# Patient Record
Sex: Male | Born: 1935 | Race: White | Hispanic: No | Marital: Married | Smoking: Former smoker
Health system: Southern US, Community
[De-identification: ages and names within clinical notes are randomized; demographics above are authoritative.]

## PROBLEM LIST (undated history)

## (undated) DIAGNOSIS — I499 Cardiac arrhythmia, unspecified: Secondary | ICD-10-CM

## (undated) DIAGNOSIS — I471 Supraventricular tachycardia: Secondary | ICD-10-CM

## (undated) DIAGNOSIS — M75100 Unspecified rotator cuff tear or rupture of unspecified shoulder, not specified as traumatic: Secondary | ICD-10-CM

## (undated) DIAGNOSIS — I10 Essential (primary) hypertension: Secondary | ICD-10-CM

## (undated) DIAGNOSIS — M1990 Unspecified osteoarthritis, unspecified site: Secondary | ICD-10-CM

## (undated) DIAGNOSIS — I4719 Other supraventricular tachycardia: Secondary | ICD-10-CM

## (undated) DIAGNOSIS — Z8719 Personal history of other diseases of the digestive system: Secondary | ICD-10-CM

## (undated) DIAGNOSIS — I219 Acute myocardial infarction, unspecified: Secondary | ICD-10-CM

## (undated) HISTORY — PX: CARDIAC CATHETERIZATION: SHX172

## (undated) HISTORY — PX: HERNIA REPAIR: SHX51

## (undated) HISTORY — PX: BACK SURGERY: SHX140

## (undated) HISTORY — PX: COLONOSCOPY: SHX174

## (undated) HISTORY — PX: SHOULDER ARTHROSCOPY W/ ROTATOR CUFF REPAIR: SHX2400

---

## 2013-11-14 HISTORY — PX: LOOP RECORDER IMPLANT: SHX5954

## 2015-03-24 ENCOUNTER — Other Ambulatory Visit: Payer: Self-pay | Admitting: Neurosurgery

## 2015-03-24 DIAGNOSIS — M549 Dorsalgia, unspecified: Secondary | ICD-10-CM

## 2015-04-07 ENCOUNTER — Other Ambulatory Visit: Payer: Self-pay

## 2015-04-30 ENCOUNTER — Other Ambulatory Visit: Payer: Self-pay | Admitting: Neurosurgery

## 2015-04-30 ENCOUNTER — Ambulatory Visit
Admission: RE | Admit: 2015-04-30 | Discharge: 2015-04-30 | Disposition: A | Discharge status: Home / Self Care | Payer: Medicare PPO | Source: Ambulatory Visit | Attending: Neurosurgery | Admitting: Neurosurgery

## 2015-04-30 DIAGNOSIS — M549 Dorsalgia, unspecified: Secondary | ICD-10-CM

## 2015-04-30 DIAGNOSIS — M48061 Spinal stenosis, lumbar region without neurogenic claudication: Secondary | ICD-10-CM

## 2015-04-30 MED ORDER — DIAZEPAM 5 MG PO TABS
5.0000 mg | ORAL_TABLET | Freq: Once | ORAL | Status: AC
  Administered 2015-04-30: 5 mg via ORAL

## 2015-04-30 MED ORDER — IOHEXOL 180 MG/ML  SOLN
15.0000 mL | Freq: Once | INTRAMUSCULAR | Status: DC | PRN
  Administered 2015-04-30: 15 mL via INTRATHECAL

## 2015-04-30 MED ORDER — ONDANSETRON HCL 4 MG/2ML IJ SOLN
4.0000 mg | Freq: Once | INTRAMUSCULAR | Status: AC
  Administered 2015-04-30: 4 mg via INTRAMUSCULAR

## 2015-04-30 MED ORDER — MEPERIDINE HCL 100 MG/ML IJ SOLN
50.0000 mg | Freq: Once | INTRAMUSCULAR | Status: AC
  Administered 2015-04-30: 50 mg via INTRAMUSCULAR

## 2015-04-30 NOTE — Progress Notes (Signed)
Patient states he has been off Eliquis for the past two days.

## 2015-04-30 NOTE — Discharge Instructions (Signed)

## 2015-05-06 ENCOUNTER — Other Ambulatory Visit: Payer: Medicare PPO

## 2015-05-08 ENCOUNTER — Other Ambulatory Visit: Payer: Self-pay | Admitting: Neurosurgery

## 2015-05-08 ENCOUNTER — Ambulatory Visit
Admission: RE | Admit: 2015-05-08 | Discharge: 2015-05-08 | Disposition: A | Discharge status: Home / Self Care | Payer: Medicare PPO | Source: Ambulatory Visit | Attending: Neurosurgery | Admitting: Neurosurgery

## 2015-05-08 DIAGNOSIS — M48061 Spinal stenosis, lumbar region without neurogenic claudication: Secondary | ICD-10-CM

## 2015-05-08 MED ORDER — IOHEXOL 180 MG/ML  SOLN
1.0000 mL | Freq: Once | INTRAMUSCULAR | Status: DC | PRN
  Administered 2015-05-08: 1 mL via EPIDURAL

## 2015-05-08 MED ORDER — METHYLPREDNISOLONE ACETATE 40 MG/ML INJ SUSP (RADIOLOG
120.0000 mg | Freq: Once | INTRAMUSCULAR | Status: AC
  Administered 2015-05-08: 120 mg via EPIDURAL

## 2015-05-08 NOTE — Discharge Instructions (Signed)

## 2015-05-27 ENCOUNTER — Other Ambulatory Visit: Payer: Self-pay | Admitting: Neurosurgery

## 2015-06-06 NOTE — Pre-Procedure Instructions (Signed)
    Melvin DoorJohnny Strickland  06/06/2015      [id number] [name]- [place], [place] - [address] [place] [id number] Phone: [phone] Fax: [phone]    Your procedure is scheduled on Tuesday, December 27.   [facility] Ad[facility] at 6:00 A.M.                Your surgery or procedure is scheduled for 10:00 AM.   Call this number if you have problems the morning of surgery :[phone]                    For any other questions, please call [phone], Monday - Friday 8 AM - 4 PM.   Remember:  Do not eat food or drink liquids after midnight Monday, December 26.  Take these medicines the morning of surgery with A SIP OF WATER : amiodarone (PACERONE), rosuvastatin (CRESTOR).                Stop Aspirin and Eliquis as instructed by your Dr.    Drucilla Schmidto not wear jewelry, make-up or nail polish.   Do not wear lotions, powders, or perfumes.                Men may shave face and neck.   Do not bring valuables to the hospital.   [facility] is not responsible for any belongings or valuables.  Contacts, dentures or bridgework may not be worn into surgery.  Leave your suitcase in the car.  After surgery it may be brought to your room.  For patients admitted to the hospital, discharge time will be determined by your treatment team.  Patients discharged the day of surgery will not be allowed to drive home.   Name and phone number of your driver:   -  Special instructions:  -  Please read over the following fact sheets that you were given. Pain Booklet, Coughing and Deep Breathing, MRSA Information and Surgical Site Infection Prevention

## 2015-06-09 ENCOUNTER — Encounter (HOSPITAL_COMMUNITY): Payer: Self-pay

## 2015-06-09 ENCOUNTER — Encounter (HOSPITAL_COMMUNITY)
Admission: RE | Admit: 2015-06-09 | Discharge: 2015-06-09 | Disposition: A | Discharge status: Home / Self Care | Payer: Medicare PPO | Source: Ambulatory Visit | Attending: Neurosurgery | Admitting: Neurosurgery

## 2015-06-09 DIAGNOSIS — M5136 Other intervertebral disc degeneration, lumbar region: Secondary | ICD-10-CM | POA: Insufficient documentation

## 2015-06-09 DIAGNOSIS — Z7982 Long term (current) use of aspirin: Secondary | ICD-10-CM | POA: Insufficient documentation

## 2015-06-09 DIAGNOSIS — Z01812 Encounter for preprocedural laboratory examination: Secondary | ICD-10-CM | POA: Insufficient documentation

## 2015-06-09 DIAGNOSIS — Z01818 Encounter for other preprocedural examination: Secondary | ICD-10-CM | POA: Diagnosis not present

## 2015-06-09 DIAGNOSIS — Z87891 Personal history of nicotine dependence: Secondary | ICD-10-CM | POA: Insufficient documentation

## 2015-06-09 DIAGNOSIS — I252 Old myocardial infarction: Secondary | ICD-10-CM | POA: Insufficient documentation

## 2015-06-09 DIAGNOSIS — Z79899 Other long term (current) drug therapy: Secondary | ICD-10-CM | POA: Insufficient documentation

## 2015-06-09 DIAGNOSIS — Z95818 Presence of other cardiac implants and grafts: Secondary | ICD-10-CM | POA: Insufficient documentation

## 2015-06-09 DIAGNOSIS — Z7902 Long term (current) use of antithrombotics/antiplatelets: Secondary | ICD-10-CM | POA: Diagnosis not present

## 2015-06-09 DIAGNOSIS — I2510 Atherosclerotic heart disease of native coronary artery without angina pectoris: Secondary | ICD-10-CM | POA: Diagnosis not present

## 2015-06-09 DIAGNOSIS — R001 Bradycardia, unspecified: Secondary | ICD-10-CM | POA: Insufficient documentation

## 2015-06-09 DIAGNOSIS — I10 Essential (primary) hypertension: Secondary | ICD-10-CM | POA: Diagnosis not present

## 2015-06-09 HISTORY — DX: Personal history of other diseases of the digestive system: Z87.19

## 2015-06-09 HISTORY — DX: Acute myocardial infarction, unspecified: I21.9

## 2015-06-09 HISTORY — DX: Unspecified rotator cuff tear or rupture of unspecified shoulder, not specified as traumatic: M75.100

## 2015-06-09 HISTORY — DX: Supraventricular tachycardia: I47.1

## 2015-06-09 HISTORY — DX: Cardiac arrhythmia, unspecified: I49.9

## 2015-06-09 HISTORY — DX: Other supraventricular tachycardia: I47.19

## 2015-06-09 HISTORY — DX: Essential (primary) hypertension: I10

## 2015-06-09 LAB — CBC
HEMATOCRIT: 42.8 % (ref 39.0–52.0)
HEMOGLOBIN: 14.2 g/dL (ref 13.0–17.0)
MCH: 31.1 pg (ref 26.0–34.0)
MCHC: 33.2 g/dL (ref 30.0–36.0)
MCV: 93.9 fL (ref 78.0–100.0)
Platelets: 234 10*3/uL (ref 150–400)
RBC: 4.56 MIL/uL (ref 4.22–5.81)
RDW: 12.8 % (ref 11.5–15.5)
WBC: 8.2 10*3/uL (ref 4.0–10.5)

## 2015-06-09 LAB — BASIC METABOLIC PANEL
ANION GAP: 9 (ref 5–15)
BUN: 5 mg/dL — ABNORMAL LOW (ref 6–20)
CO2: 28 mmol/L (ref 22–32)
Calcium: 9.2 mg/dL (ref 8.9–10.3)
Chloride: 103 mmol/L (ref 101–111)
Creatinine, Ser: 0.95 mg/dL (ref 0.61–1.24)
Glucose, Bld: 108 mg/dL — ABNORMAL HIGH (ref 65–99)
POTASSIUM: 4.7 mmol/L (ref 3.5–5.1)
SODIUM: 140 mmol/L (ref 135–145)

## 2015-06-09 LAB — SURGICAL PCR SCREEN
MRSA, PCR: NEGATIVE
STAPHYLOCOCCUS AUREUS: NEGATIVE

## 2015-06-09 NOTE — Progress Notes (Signed)
Multiple forms prepared for records to be faxed [facility] for [facility]. Review, from Dr. Betsy PriesG. Miller & Dr. Blair DolphinM. Valentine in [place].   Pt. Reports that he had an MI in 1980's, cath. done to [facility] at that [facility]. Then reports having another cath 2014-treated /w  2 stents at that time. Since those  stents he had episodes of passing out & he was seen by Dr. Blair DolphinM. Valentine & had a "monitor" placed under the skin & has a device that is next to his bed that monitors his heartbeat while he sleeps. Pt. Has a visit schedule /w Dr. Betsy PriesG. Miller for 06/10/2015 for surgical clearance. Pt. Knows to get instructions on Eliquis & aspirin from Dr. Hyacinth MeekerMiller at his appt. On 06/10/2015.  Call to A. Zelenak, PAC, reported pt.'s history  of "heart monitor" &  Scheduled visit for 06/10/2015.

## 2015-06-09 NOTE — Procedures (Signed)
Call to pharmacy (tech) for help with getting meds & doses recorderd properly.

## 2015-06-09 NOTE — Progress Notes (Signed)
Call to pt. To clarify his med. Dose & schedule for amiodarone. Pt. Reports its a 1/2 tab (tab is 200mg ) MWF.  Pt. States someone called him to arrive 0800 on dos due to schedule change. Note in chart on instructions sheet.

## 2015-06-09 NOTE — Pre-Procedure Instructions (Signed)
Melvin DoorJohnny Strickland  06/09/2015      [id number] Melvin Strickland- [place], [place] - [address] [place] [id number] Phone: [phone] Fax: [phone]    Your procedure is scheduled on 06/17/2015.  [facility] Admitting at 6:00 A.M.  Call this number if you have problems the morning of surgery:  [phone]   Remember:  Do not eat food or drink liquids after midnight. ON Monday NIGHT   Take these medicines the morning of surgery with A SIP OF WATER: AMIODARONE               YOU MUST GET INSTRUCTION FOR HOLDING ELIQUIS & ASPIRIN FROM DR. MILLER    Do not wear jewelry   Do not wear lotions, powders, or perfumes.  You may wear deodorant.    Men may shave face and neck.   Do not bring valuables to the hospital.   [facility] is not responsible for any belongings or valuables.  Contacts, dentures or bridgework may not be worn into surgery.  Leave your suitcase in the car.  After surgery it may be brought to your room.  For patients admitted to the hospital, discharge time will be determined by your treatment team.  Patients discharged the day of surgery will not be allowed to drive home.   Name and phone number of your driver:   With wife  Special instructions:  Special Instructions: [facility] - Preparing for Surgery  Before surgery, you can play an important role.  Because skin is not sterile, your skin needs to be as free of germs as possible.  You can reduce the number of germs on you skin by washing with CHG (chlorahexidine gluconate) soap before surgery.  CHG is an antiseptic cleaner which kills germs and bonds with the skin to continue killing germs even after washing.  Please DO NOT use if you have an allergy to CHG or antibacterial soaps.  If your skin becomes reddened/irritated stop using the CHG and inform your nurse when you arrive at Short Stay.  Do not shave (including legs and underarms) for at least 48 hours prior to the first CHG  shower.  You may shave your face.  Please follow these instructions carefully:   1.  Shower with CHG Soap the night before surgery and the  morning of Surgery.  2.  If you choose to wash your hair, wash your hair first as usual with your  normal shampoo.  3.  After you shampoo, rinse your hair and body thoroughly to remove the  Shampoo.  4.  Use CHG as you would any other liquid soap.  You can apply chg directly to the skin and wash gently with scrungie or a clean washcloth.  5.  Apply the CHG Soap to your body ONLY FROM THE NECK DOWN.    Do not use on open wounds or open sores.  Avoid contact with your eyes, ears, mouth and genitals (private parts).  Wash genitals (private parts)   with your normal soap.  6.  Wash thoroughly, paying special attention to the area where your surgery will be performed.  7.  Thoroughly rinse your body with warm water from the neck down.  8.  DO NOT shower/wash with your normal soap after using and rinsing off   the CHG Soap.  9.  Pat yourself dry with a clean towel.            10.  Wear  clean pajamas.            11.  Place clean sheets on your bed the night of your first shower and do not sleep with pets.  Day of Surgery  Do not apply any lotions/deodorants the morning of surgery.  Please wear clean clothes to the hospital/surgery center.  Please read over the following fact sheets that you were given. Pain Booklet, Coughing and Deep Breathing, MRSA Information and Surgical Site Infection Prevention

## 2015-06-10 NOTE — Progress Notes (Addendum)
Anesthesia Chart Review:  Pt is an 81 year old male scheduled for L4-5, L5-S1 foraminotomy on 06/17/2015 with Dr. Jeral FruitBotero.   PMH includes:  CAD, MI, HTN, atrial tachycardia, has loop recorder implanted ([employer], 10/2013). Former smoker. BMI 24.  Medications include: amiodarone, eliquis, ASA, lisinopril, crestor. Pt to stop eliquis 5 days prior to surgery.   Preoperative labs reviewed.    EKG 06/09/15: sinus bradycardia (54 bpm).  Awaiting records.   Rica Mastngela Kabbe, FNP-BC [facility] Short [facility] Center/Anesthesiology Phone: [phone] 06/10/2015 4:55 PM  Addendum: Cardiac clearance note received from Dr. Daryel NovemberGary Miller with [facility] [place]. He cleared patient with moderate CV risk.   10/24/13 Nuclear stress test:  1. Mild inferior hypoperfusion, resolved post stress. 2. Normal LV contraction. EF 62%. 3. No reversible ischemic perfusion defect. 4. Low risk scan.   10/01/13 Echo: 1. Mild LVH. 2. LA and RV are dilated. 3. Mild MR, TR, and PI. 4. Normal LV contraction. EF 60-65%.  07/10/14 note from EP cardiologist Dr. Collier BullockMichael Valentine says, recent "Holter monitor revealed short episodes of nonsustained ventricular tachycardia in the lower 100 120 beats per minute. There were no prolonged pauses, high-grade AV block or rapid tachyarrhythmias that were symptomatic." He had a loop recorder implanted 11/14/13 for evaluation of syncope and interrogation revealed SVT with rapid tachycardia and rates up to 160 bpm. He did not have syncope or near syncope with these. Dr. Georgena SpurlingSackett performed EP studies which revealed a variable cycle atrial tachycardia that was tiven to be very difficult to ablate and performed DCCV with initiation of sotalol therapy. Sotalol was discontinued to due prolonged QT, and he was switched to amiodarone.   Cardiac cath 08/16/11 [facility]):  - LM normal - LAD with proximal 25%, mid 25-50%, and distal 25% stenosis - CX with proximal 25%, mid 25% stenosis -  Obtuse marginal had diffuse 25% stenosis - RCA is the dominant vessel with proximal 25% and distal 75% stenosis. TL branch with 25% stenosis. PDA branch had 25% stenosis - PCI and stenting of RCA recommended.  08/16/11: PCI/DES to distal RCA. 75% stenosis reduced to mild irregularity.   If no changes, I anticipate patient can proceed with surgery as scheduled.   Velna Ochsllison Shemicka Cohrs, PA-C [facility] Short [facility]/Anesthesiology Phone [phone] 06/12/2015 3:28 PM

## 2015-06-11 ENCOUNTER — Encounter (HOSPITAL_COMMUNITY): Payer: Self-pay

## 2015-06-12 ENCOUNTER — Encounter (HOSPITAL_COMMUNITY): Payer: Self-pay

## 2015-06-13 NOTE — H&P (Signed)
Bethann BerkshireJohnny [name] is an 81 y.o. male.   Chief Complaint: bilateral leg pain HPI: patient who in the past underwent bilateral lumbar laminectomies but lately he is complaining of pain and  Burning sensation to both legs no better with conservative treatment . Myelogram is positive for recurrent stenosis  Past Medical History  Diagnosis Date  . Myocardial infarction (HCC)   . Hypertension   . Dysrhythmia   . History of hiatal hernia     told- yes- 30 yrs.ago  . Torn rotator cuff     L side   . Atrial tachycardia [facility](HCC)     Past Surgical History  Procedure Laterality Date  . Back surgery    . Hernia repair Bilateral     inguinal - repair- x2  . Shoulder arthroscopy w/ rotator cuff repair Right   . Colonoscopy    . Cardiac catheterization  1990's & [address]2014    [facility] , 2 stents- 2014  . Loop recorder implant  11/14/13    No family history on file. Social History:  reports that he quit smoking about 33 years ago. He does not have any smokeless tobacco history on file. He reports that he does not drink alcohol or use illicit drugs.  Allergies:  Allergies  Allergen Reactions  . Morphine And Related Nausea And Vomiting    "made me deathly sick"     No prescriptions prior to admission    No results found for this or any previous visit (from the past 48 hour(s)). No results found.  Review of Systems  Constitutional: Negative.   HENT: Positive for congestion. Negative for nosebleeds.   Eyes: Positive for blurred vision.  Respiratory: Negative.   Cardiovascular: Negative.   Gastrointestinal: Positive for abdominal pain.  Genitourinary: Negative.   Musculoskeletal: Positive for back pain.  Skin: Negative.   Neurological: Positive for sensory change and focal weakness.  Endo/Heme/Allergies: Negative.   Psychiatric/Behavioral: Positive for depression.    There were no vitals taken for this visit. Physical Exam hent,nl. Neck, nl. Cv, nl. Lungs, clear. Abdomen, nl.  Extremities, nl. NEURO weakness of dorsiflexion. SLR positive at 60 degrees. DTR nl. Lumbar myelogram shows stenosis at l4-5, l5s1 at the level of the foramens. Assessment/Plan Decompression from l4 to s1 . He and his wife are aware of risks and benefits  Daven Pinckney M 06/13/2015, 3:40 PM

## 2015-06-16 MED ORDER — CEFAZOLIN SODIUM-DEXTROSE 2-3 GM-% IV SOLR
2.0000 g | INTRAVENOUS | Status: AC
  Administered 2015-06-17: 2 g via INTRAVENOUS
  Filled 2015-06-16: qty 50

## 2015-06-17 ENCOUNTER — Ambulatory Visit (HOSPITAL_COMMUNITY): Payer: Medicare PPO

## 2015-06-17 ENCOUNTER — Ambulatory Visit (HOSPITAL_COMMUNITY): Payer: Medicare PPO | Admitting: Certified Registered"

## 2015-06-17 ENCOUNTER — Encounter (HOSPITAL_COMMUNITY): Payer: Self-pay | Admitting: Certified Registered"

## 2015-06-17 ENCOUNTER — Inpatient Hospital Stay (HOSPITAL_COMMUNITY)
Admission: AD | Admit: 2015-06-17 | Discharge: 2015-06-26 | DRG: 982 | Disposition: A | Discharge status: Home Health | Payer: Medicare PPO | Source: Ambulatory Visit | Attending: Neurosurgery | Admitting: Neurosurgery

## 2015-06-17 ENCOUNTER — Ambulatory Visit (HOSPITAL_COMMUNITY): Payer: Medicare PPO | Admitting: Vascular Surgery

## 2015-06-17 ENCOUNTER — Encounter (HOSPITAL_COMMUNITY)
Admission: AD | Disposition: A | Discharge status: Home Health | Payer: Self-pay | Source: Ambulatory Visit | Attending: Neurosurgery

## 2015-06-17 DIAGNOSIS — M96842 Postprocedural seroma of a musculoskeletal structure following a musculoskeletal system procedure: Secondary | ICD-10-CM | POA: Diagnosis not present

## 2015-06-17 DIAGNOSIS — M4806 Spinal stenosis, lumbar region: Secondary | ICD-10-CM | POA: Diagnosis present

## 2015-06-17 DIAGNOSIS — Y838 Other surgical procedures as the cause of abnormal reaction of the patient, or of later complication, without mention of misadventure at the time of the procedure: Secondary | ICD-10-CM | POA: Diagnosis not present

## 2015-06-17 DIAGNOSIS — I10 Essential (primary) hypertension: Secondary | ICD-10-CM | POA: Diagnosis present

## 2015-06-17 DIAGNOSIS — M79604 Pain in right leg: Secondary | ICD-10-CM | POA: Diagnosis present

## 2015-06-17 DIAGNOSIS — R51 Headache: Secondary | ICD-10-CM | POA: Diagnosis not present

## 2015-06-17 DIAGNOSIS — S065X9A Traumatic subdural hemorrhage with loss of consciousness of unspecified duration, initial encounter: Secondary | ICD-10-CM

## 2015-06-17 DIAGNOSIS — R519 Headache, unspecified: Secondary | ICD-10-CM

## 2015-06-17 DIAGNOSIS — I252 Old myocardial infarction: Secondary | ICD-10-CM | POA: Diagnosis not present

## 2015-06-17 DIAGNOSIS — S065XAA Traumatic subdural hemorrhage with loss of consciousness status unknown, initial encounter: Secondary | ICD-10-CM

## 2015-06-17 DIAGNOSIS — Y793 Surgical instruments, materials and orthopedic devices (including sutures) associated with adverse incidents: Secondary | ICD-10-CM | POA: Diagnosis not present

## 2015-06-17 DIAGNOSIS — Z87891 Personal history of nicotine dependence: Secondary | ICD-10-CM | POA: Diagnosis not present

## 2015-06-17 DIAGNOSIS — M5416 Radiculopathy, lumbar region: Secondary | ICD-10-CM | POA: Diagnosis present

## 2015-06-17 DIAGNOSIS — M48062 Spinal stenosis, lumbar region with neurogenic claudication: Secondary | ICD-10-CM | POA: Diagnosis present

## 2015-06-17 DIAGNOSIS — Z419 Encounter for procedure for purposes other than remedying health state, unspecified: Secondary | ICD-10-CM

## 2015-06-17 HISTORY — PX: LUMBAR LAMINECTOMY/DECOMPRESSION MICRODISCECTOMY: SHX5026

## 2015-06-17 SURGERY — LUMBAR LAMINECTOMY/DECOMPRESSION MICRODISCECTOMY 2 LEVELS
Anesthesia: General

## 2015-06-17 MED ORDER — HEMOSTATIC AGENTS (NO CHARGE) OPTIME
TOPICAL | Status: DC | PRN
  Administered 2015-06-17 (×2): 1 via TOPICAL

## 2015-06-17 MED ORDER — SODIUM CHLORIDE 0.9 % IJ SOLN
3.0000 mL | Freq: Two times a day (BID) | INTRAMUSCULAR | Status: DC
  Administered 2015-06-17 – 2015-06-25 (×12): 3 mL via INTRAVENOUS

## 2015-06-17 MED ORDER — MENTHOL 3 MG MT LOZG
1.0000 | LOZENGE | OROMUCOSAL | Status: DC | PRN
  Filled 2015-06-17: qty 9

## 2015-06-17 MED ORDER — FENTANYL CITRATE (PF) 100 MCG/2ML IJ SOLN
INTRAMUSCULAR | Status: AC
  Administered 2015-06-17: 50 ug via INTRAVENOUS
  Filled 2015-06-17: qty 2

## 2015-06-17 MED ORDER — FENTANYL CITRATE (PF) 250 MCG/5ML IJ SOLN
INTRAMUSCULAR | Status: DC | PRN
  Administered 2015-06-17: 150 ug via INTRAVENOUS

## 2015-06-17 MED ORDER — AMIODARONE HCL 100 MG PO TABS
100.0000 mg | ORAL_TABLET | ORAL | Status: DC
  Administered 2015-06-18 – 2015-06-25 (×4): 100 mg via ORAL
  Filled 2015-06-17 (×5): qty 1

## 2015-06-17 MED ORDER — ONDANSETRON HCL 4 MG/2ML IJ SOLN
INTRAMUSCULAR | Status: AC
  Filled 2015-06-17: qty 2

## 2015-06-17 MED ORDER — NEOSTIGMINE METHYLSULFATE 10 MG/10ML IV SOLN
INTRAVENOUS | Status: AC
  Filled 2015-06-17: qty 1

## 2015-06-17 MED ORDER — CEFAZOLIN SODIUM 1-5 GM-% IV SOLN
1.0000 g | Freq: Three times a day (TID) | INTRAVENOUS | Status: AC
  Administered 2015-06-17 – 2015-06-18 (×2): 1 g via INTRAVENOUS
  Filled 2015-06-17 (×2): qty 50

## 2015-06-17 MED ORDER — LIDOCAINE HCL (CARDIAC) 20 MG/ML IV SOLN
INTRAVENOUS | Status: AC
  Filled 2015-06-17: qty 5

## 2015-06-17 MED ORDER — PHENOL 1.4 % MT LIQD: 1.0000 | OROMUCOSAL | Status: DC | PRN

## 2015-06-17 MED ORDER — ONDANSETRON HCL 4 MG/2ML IJ SOLN
4.0000 mg | INTRAMUSCULAR | Status: DC | PRN
  Administered 2015-06-17 (×2): 4 mg via INTRAVENOUS
  Filled 2015-06-17 (×2): qty 2

## 2015-06-17 MED ORDER — PROPOFOL 10 MG/ML IV BOLUS
INTRAVENOUS | Status: DC | PRN
  Administered 2015-06-17: 100 mg via INTRAVENOUS

## 2015-06-17 MED ORDER — MAGNESIUM HYDROXIDE 400 MG/5ML PO SUSP
30.0000 mL | Freq: Every day | ORAL | Status: DC | PRN
  Filled 2015-06-17: qty 30

## 2015-06-17 MED ORDER — FENTANYL CITRATE (PF) 100 MCG/2ML IJ SOLN
INTRAMUSCULAR | Status: AC
  Administered 2015-06-17: 25 ug via INTRAVENOUS
  Filled 2015-06-17: qty 2

## 2015-06-17 MED ORDER — ROSUVASTATIN CALCIUM 10 MG PO TABS
10.0000 mg | ORAL_TABLET | Freq: Every day | ORAL | Status: DC
  Administered 2015-06-17 – 2015-06-26 (×9): 10 mg via ORAL
  Filled 2015-06-17 (×11): qty 1

## 2015-06-17 MED ORDER — HYDROMORPHONE HCL 1 MG/ML IJ SOLN
1.0000 mg | INTRAMUSCULAR | Status: DC | PRN
  Administered 2015-06-17 – 2015-06-18 (×4): 1 mg via INTRAVENOUS
  Filled 2015-06-17 (×5): qty 1

## 2015-06-17 MED ORDER — THROMBIN 5000 UNITS EX SOLR
CUTANEOUS | Status: DC | PRN
  Administered 2015-06-17 (×4): 5000 [IU] via TOPICAL

## 2015-06-17 MED ORDER — OXYCODONE-ACETAMINOPHEN 5-325 MG PO TABS
1.0000 | ORAL_TABLET | ORAL | Status: DC | PRN
  Administered 2015-06-17 (×2): 2 via ORAL
  Filled 2015-06-17 (×2): qty 2

## 2015-06-17 MED ORDER — ONDANSETRON HCL 4 MG/2ML IJ SOLN
INTRAMUSCULAR | Status: DC | PRN
  Administered 2015-06-17: 4 mg via INTRAVENOUS

## 2015-06-17 MED ORDER — SODIUM CHLORIDE 0.9 % IV SOLN: 250.0000 mL | INTRAVENOUS | Status: DC

## 2015-06-17 MED ORDER — FENTANYL CITRATE (PF) 100 MCG/2ML IJ SOLN: 25.0000 ug | INTRAMUSCULAR | Status: DC | PRN

## 2015-06-17 MED ORDER — SODIUM CHLORIDE 0.9 % IV SOLN
INTRAVENOUS | Status: DC
  Administered 2015-06-17 – 2015-06-18 (×2): via INTRAVENOUS
  Administered 2015-06-20: 75 mL/h via INTRAVENOUS

## 2015-06-17 MED ORDER — LACTATED RINGERS IV SOLN: INTRAVENOUS | Status: DC

## 2015-06-17 MED ORDER — LISINOPRIL 10 MG PO TABS
10.0000 mg | ORAL_TABLET | Freq: Every day | ORAL | Status: DC
  Administered 2015-06-18 – 2015-06-26 (×9): 10 mg via ORAL
  Filled 2015-06-17 (×9): qty 1

## 2015-06-17 MED ORDER — EPHEDRINE SULFATE 50 MG/ML IJ SOLN
INTRAMUSCULAR | Status: DC | PRN
  Administered 2015-06-17: 5 mg via INTRAVENOUS
  Administered 2015-06-17: 10 mg via INTRAVENOUS

## 2015-06-17 MED ORDER — NEOSTIGMINE METHYLSULFATE 10 MG/10ML IV SOLN
INTRAVENOUS | Status: DC | PRN
  Administered 2015-06-17: 3 mg via INTRAVENOUS

## 2015-06-17 MED ORDER — SODIUM CHLORIDE 0.9 % IJ SOLN: 3.0000 mL | INTRAMUSCULAR | Status: DC | PRN

## 2015-06-17 MED ORDER — ASPIRIN EC 81 MG PO TBEC
162.0000 mg | DELAYED_RELEASE_TABLET | Freq: Every day | ORAL | Status: DC
  Administered 2015-06-17 – 2015-06-26 (×10): 162 mg via ORAL
  Filled 2015-06-17 (×11): qty 2

## 2015-06-17 MED ORDER — ROCURONIUM BROMIDE 50 MG/5ML IV SOLN
INTRAVENOUS | Status: AC
  Filled 2015-06-17: qty 1

## 2015-06-17 MED ORDER — OXYCODONE HCL 5 MG PO TABS
10.0000 mg | ORAL_TABLET | ORAL | Status: DC | PRN
  Administered 2015-06-17 – 2015-06-20 (×6): 10 mg via ORAL
  Administered 2015-06-20: 5 mg via ORAL
  Filled 2015-06-17 (×8): qty 2

## 2015-06-17 MED ORDER — FENTANYL CITRATE (PF) 250 MCG/5ML IJ SOLN
INTRAMUSCULAR | Status: AC
  Filled 2015-06-17: qty 5

## 2015-06-17 MED ORDER — ONDANSETRON HCL 4 MG/2ML IJ SOLN: 4.0000 mg | Freq: Once | INTRAMUSCULAR | Status: DC | PRN

## 2015-06-17 MED ORDER — BUPIVACAINE LIPOSOME 1.3 % IJ SUSP
INTRAMUSCULAR | Status: DC | PRN
  Administered 2015-06-17: 20 mL

## 2015-06-17 MED ORDER — ALUM & MAG HYDROXIDE-SIMETH 200-200-20 MG/5ML PO SUSP
30.0000 mL | ORAL | Status: DC | PRN
  Administered 2015-06-18 – 2015-06-21 (×4): 30 mL via ORAL
  Filled 2015-06-17 (×3): qty 30

## 2015-06-17 MED ORDER — GLYCOPYRROLATE 0.2 MG/ML IJ SOLN
INTRAMUSCULAR | Status: AC
  Filled 2015-06-17: qty 2

## 2015-06-17 MED ORDER — LIDOCAINE HCL (CARDIAC) 20 MG/ML IV SOLN
INTRAVENOUS | Status: DC | PRN
  Administered 2015-06-17: 100 mg via INTRAVENOUS

## 2015-06-17 MED ORDER — MORPHINE SULFATE (PF) 2 MG/ML IV SOLN
1.0000 mg | INTRAVENOUS | Status: DC | PRN
  Administered 2015-06-17: 2 mg via INTRAVENOUS
  Filled 2015-06-17: qty 1

## 2015-06-17 MED ORDER — DIAZEPAM 5 MG PO TABS
5.0000 mg | ORAL_TABLET | Freq: Four times a day (QID) | ORAL | Status: DC | PRN
  Administered 2015-06-17 – 2015-06-18 (×3): 5 mg via ORAL
  Filled 2015-06-17 (×3): qty 1

## 2015-06-17 MED ORDER — FENTANYL CITRATE (PF) 100 MCG/2ML IJ SOLN
25.0000 ug | INTRAMUSCULAR | Status: DC | PRN
  Administered 2015-06-17: 25 ug via INTRAVENOUS
  Administered 2015-06-17 (×2): 50 ug via INTRAVENOUS

## 2015-06-17 MED ORDER — GLYCOPYRROLATE 0.2 MG/ML IJ SOLN
INTRAMUSCULAR | Status: DC | PRN
  Administered 2015-06-17: 0.4 mg via INTRAVENOUS

## 2015-06-17 MED ORDER — ACETAMINOPHEN 650 MG RE SUPP: 650.0000 mg | RECTAL | Status: DC | PRN

## 2015-06-17 MED ORDER — 0.9 % SODIUM CHLORIDE (POUR BTL) OPTIME
TOPICAL | Status: DC | PRN
  Administered 2015-06-17: 1000 mL

## 2015-06-17 MED ORDER — ROCURONIUM BROMIDE 100 MG/10ML IV SOLN
INTRAVENOUS | Status: DC | PRN
  Administered 2015-06-17: 50 mg via INTRAVENOUS

## 2015-06-17 MED ORDER — THROMBIN 5000 UNITS EX SOLR
OROMUCOSAL | Status: DC | PRN
  Administered 2015-06-17: 11:00:00 via TOPICAL

## 2015-06-17 MED ORDER — ACETAMINOPHEN 325 MG PO TABS
650.0000 mg | ORAL_TABLET | ORAL | Status: DC | PRN
  Administered 2015-06-18 – 2015-06-24 (×12): 650 mg via ORAL
  Filled 2015-06-17 (×13): qty 2

## 2015-06-17 MED ORDER — LACTATED RINGERS IV SOLN
INTRAVENOUS | Status: DC
  Administered 2015-06-17: 50 mL/h via INTRAVENOUS
  Administered 2015-06-17: 10:00:00 via INTRAVENOUS

## 2015-06-17 MED ORDER — BUPIVACAINE LIPOSOME 1.3 % IJ SUSP
20.0000 mL | INTRAMUSCULAR | Status: AC
  Filled 2015-06-17: qty 20

## 2015-06-17 MED ORDER — PROPOFOL 10 MG/ML IV BOLUS
INTRAVENOUS | Status: AC
  Filled 2015-06-17: qty 20

## 2015-06-17 SURGICAL SUPPLY — 57 items
BENZOIN TINCTURE PRP APPL 2/3 (GAUZE/BANDAGES/DRESSINGS) ×3 IMPLANT
BLADE CLIPPER SURG (BLADE) IMPLANT
BUR ACORN 6.0 (BURR) IMPLANT
BUR ACORN 6.0MM (BURR)
BUR MATCHSTICK NEURO 3.0 LAGG (BURR) ×3 IMPLANT
CANISTER SUCT 3000ML PPV (MISCELLANEOUS) ×3 IMPLANT
CLOSURE WOUND 1/2 X4 (GAUZE/BANDAGES/DRESSINGS) ×1
DRAPE LAPAROTOMY 100X72X124 (DRAPES) ×3 IMPLANT
DRAPE MICROSCOPE LEICA (MISCELLANEOUS) ×3 IMPLANT
DRAPE POUCH INSTRU U-SHP 10X18 (DRAPES) ×3 IMPLANT
DRSG OPSITE POSTOP 4X8 (GAUZE/BANDAGES/DRESSINGS) ×3 IMPLANT
DRSG PAD ABDOMINAL 8X10 ST (GAUZE/BANDAGES/DRESSINGS) IMPLANT
DURAPREP 26ML APPLICATOR (WOUND CARE) ×3 IMPLANT
DURASEAL APPLICATOR TIP (TIP) ×3 IMPLANT
DURASEAL SPINE SEALANT 3ML (MISCELLANEOUS) ×3 IMPLANT
ELECT REM PT RETURN 9FT ADLT (ELECTROSURGICAL) ×3
ELECTRODE REM PT RTRN 9FT ADLT (ELECTROSURGICAL) ×1 IMPLANT
GAUZE SPONGE 4X4 12PLY STRL (GAUZE/BANDAGES/DRESSINGS) ×3 IMPLANT
GAUZE SPONGE 4X4 16PLY XRAY LF (GAUZE/BANDAGES/DRESSINGS) IMPLANT
GLOVE BIO SURGEON STRL SZ8 (GLOVE) ×3 IMPLANT
GLOVE BIO SURGEON STRL SZ8.5 (GLOVE) ×3 IMPLANT
GLOVE BIOGEL M 8.0 STRL (GLOVE) ×3 IMPLANT
GLOVE ECLIPSE 7.5 STRL STRAW (GLOVE) ×3 IMPLANT
GLOVE EXAM NITRILE LRG STRL (GLOVE) IMPLANT
GLOVE EXAM NITRILE MD LF STRL (GLOVE) IMPLANT
GLOVE EXAM NITRILE XL STR (GLOVE) IMPLANT
GLOVE EXAM NITRILE XS STR PU (GLOVE) IMPLANT
GLOVE INDICATOR 7.5 STRL GRN (GLOVE) ×3 IMPLANT
GOWN STRL REUS W/ TWL LRG LVL3 (GOWN DISPOSABLE) ×1 IMPLANT
GOWN STRL REUS W/ TWL XL LVL3 (GOWN DISPOSABLE) ×1 IMPLANT
GOWN STRL REUS W/TWL 2XL LVL3 (GOWN DISPOSABLE) ×3 IMPLANT
GOWN STRL REUS W/TWL LRG LVL3 (GOWN DISPOSABLE) ×2
GOWN STRL REUS W/TWL XL LVL3 (GOWN DISPOSABLE) ×2
HEMOSTAT POWDER SURGIFOAM 1G (HEMOSTASIS) ×3 IMPLANT
KIT BASIN OR (CUSTOM PROCEDURE TRAY) ×3 IMPLANT
KIT ROOM TURNOVER OR (KITS) ×3 IMPLANT
NEEDLE HYPO 18GX1.5 BLUNT FILL (NEEDLE) IMPLANT
NEEDLE HYPO 21X1.5 SAFETY (NEEDLE) ×3 IMPLANT
NEEDLE HYPO 25X1 1.5 SAFETY (NEEDLE) IMPLANT
NEEDLE SPNL 20GX3.5 QUINCKE YW (NEEDLE) IMPLANT
NS IRRIG 1000ML POUR BTL (IV SOLUTION) ×3 IMPLANT
PACK LAMINECTOMY NEURO (CUSTOM PROCEDURE TRAY) ×3 IMPLANT
PAD ARMBOARD 7.5X6 YLW CONV (MISCELLANEOUS) ×9 IMPLANT
PATTIES SURGICAL .5 X1 (DISPOSABLE) ×3 IMPLANT
RUBBERBAND STERILE (MISCELLANEOUS) ×6 IMPLANT
SPONGE LAP 4X18 X RAY DECT (DISPOSABLE) IMPLANT
SPONGE SURGIFOAM ABS GEL SZ50 (HEMOSTASIS) ×6 IMPLANT
STRIP CLOSURE SKIN 1/2X4 (GAUZE/BANDAGES/DRESSINGS) ×2 IMPLANT
SUT VIC AB 0 CT1 18XCR BRD8 (SUTURE) ×2 IMPLANT
SUT VIC AB 0 CT1 8-18 (SUTURE) ×4
SUT VIC AB 2-0 CP2 18 (SUTURE) ×3 IMPLANT
SUT VIC AB 3-0 SH 8-18 (SUTURE) ×3 IMPLANT
SYR 20CC LL (SYRINGE) ×3 IMPLANT
SYR 5ML LL (SYRINGE) IMPLANT
TOWEL OR 17X24 6PK STRL BLUE (TOWEL DISPOSABLE) ×3 IMPLANT
TOWEL OR 17X26 10 PK STRL BLUE (TOWEL DISPOSABLE) ×3 IMPLANT
WATER STERILE IRR 1000ML POUR (IV SOLUTION) ×3 IMPLANT

## 2015-06-17 NOTE — Progress Notes (Signed)
Pt admitted to 5C17 from neuro PACU.  Pt is to be on flat bedrest x 24 hours and patient and family understand.  Pt complaining of pain.  Medicated in PACU just before transfer over.  Explained to patient and family, asking for meds, I will medicate as soon as possible.  Bed alarm set;  Call bell within reach.  Pt verbalizes understanding of calling for assistance.  Will continue to monitor and assess.

## 2015-06-17 NOTE — Progress Notes (Signed)
Patient ID: Melvin DoorJohnny Strickland, male   DOB: 1931-02-08, 81 y.o.   MRN: [id number] C/o incisional pain plus pain in both knees.  Switch to pca moephine

## 2015-06-17 NOTE — Anesthesia Preprocedure Evaluation (Addendum)
Anesthesia Evaluation  Patient identified by MRN, date of birth, ID band Patient awake    Reviewed: Allergy & Precautions, NPO status , Patient's Chart, lab work & pertinent test results  History of Anesthesia Complications Negative for: history of anesthetic complications  Airway Mallampati: II  TM Distance: >3 FB Neck ROM: Full    Dental  (+) Dental Advisory Given, Edentulous Upper, Edentulous Lower, Lower Dentures, Upper Dentures   Pulmonary former smoker,    Pulmonary exam normal breath sounds clear to auscultation       Cardiovascular Exercise Tolerance: Good hypertension, Pt. on medications + CAD, + Past MI and + Cardiac Stents  + dysrhythmias (atrial tachycardia)  Rhythm:Regular Rate:Bradycardia  10/24/13 Nuclear stress test:  1. Mild inferior hypoperfusion, resolved post stress. 2. Normal LV contraction. EF 62%. 3. No reversible ischemic perfusion defect. 4. Low risk scan.   10/01/13 Echo: 1. Mild LVH. 2. LA and RV are dilated. 3. Mild MR, TR, and PI. 4. Normal LV contraction. EF 60-65%.  07/10/14 note from EP cardiologist Dr. Collier Bullock says, recent "Holter monitor revealed short episodes of nonsustained ventricular tachycardia in the lower 100 120 beats per minute. There were no prolonged pauses, high-grade AV block or rapid tachyarrhythmias that were symptomatic." He had a loop recorder implanted 11/14/13 for evaluation of syncope and interrogation revealed SVT with rapid tachycardia and rates up to 160 bpm. He did not have syncope or near syncope with these. Dr. Georgena Spurling performed EP studies which revealed a variable cycle atrial tachycardia that was tiven to be very difficult to ablate and performed DCCV with initiation of sotalol therapy. Sotalol was discontinued to due prolonged QT, and he was switched to amiodarone.   Cardiac cath 08/16/11 [facility]):  - LM normal - LAD with proximal 25%, mid 25-50%, and distal  25% stenosis - CX with proximal 25%, mid 25% stenosis - Obtuse marginal had diffuse 25% stenosis - RCA is the dominant vessel with proximal 25% and distal 75% stenosis. TL branch with 25% stenosis. PDA branch had 25% stenosis - PCI and stenting of RCA recommended.   08/16/11: PCI/DES to distal RCA. 75% stenosis reduced to mild irregularity.   Neuro/Psych negative neurological ROS  negative psych ROS   GI/Hepatic negative GI ROS, Neg liver ROS,   Endo/Other  negative endocrine ROS  Renal/GU negative Renal ROS     Musculoskeletal negative musculoskeletal ROS (+)   Abdominal   Peds  Hematology  (+) Blood dyscrasia (on Apixaban- stopped 12/21), ,   Anesthesia Other Findings Day of surgery medications reviewed with the patient.  Reproductive/Obstetrics                            Anesthesia Physical Anesthesia Plan  ASA: III  Anesthesia Plan: General   Post-op Pain Management:    Induction: Intravenous  Airway Management Planned: Oral ETT  Additional Equipment:   Intra-op Plan:   Post-operative Plan: Extubation in OR  Informed Consent: I have reviewed the patients History and Physical, chart, labs and discussed the procedure including the risks, benefits and alternatives for the proposed anesthesia with the patient or authorized representative who has indicated his/her understanding and acceptance.   Dental advisory given  Plan Discussed with: CRNA  Anesthesia Plan Comments: (Risks/benefits of general anesthesia discussed with patient including risk of damage to teeth, lips, gum, and tongue, nausea/vomiting, allergic reactions to medications, and the possibility of heart attack, stroke and death.  All patient questions  answered.  Patient wishes to proceed.)        Anesthesia Quick Evaluation

## 2015-06-17 NOTE — Anesthesia Procedure Notes (Signed)
Procedure Name: Intubation Date/Time: 06/17/2015 9:29 AM Performed by: Jerilee HohMUMM, Hayden Mabin N Pre-anesthesia Checklist: Patient identified, Emergency Drugs available, Suction available and Patient being monitored Patient Re-evaluated:Patient Re-evaluated prior to inductionOxygen Delivery Method: Circle system utilized Preoxygenation: Pre-oxygenation with 100% oxygen Intubation Type: IV induction Ventilation: Mask ventilation without difficulty Laryngoscope Size: Mac and 4 Grade View: Grade I Tube type: Oral Tube size: 7.5 mm Number of attempts: 1 Airway Equipment and Method: Stylet Placement Confirmation: ETT inserted through vocal cords under direct vision,  positive ETCO2 and breath sounds checked- equal and bilateral Secured at: 22 cm Tube secured with: Tape Dental Injury: Teeth and Oropharynx as per pre-operative assessment

## 2015-06-17 NOTE — Transfer of Care (Signed)
Immediate Anesthesia Transfer of Care Note  Patient: Melvin Strickland  Procedure(s) Performed: Procedure(s): lumbar four-five,lumbar five sacral-one Foraminotomy (N/A)  Patient Location: PACU  Anesthesia Type:General  Level of Consciousness: awake, alert , oriented and patient cooperative  Airway & Oxygen Therapy: Patient Spontanous Breathing and Patient connected to nasal cannula oxygen  Post-op Assessment: Report given to RN, Post -op Vital signs reviewed and stable and Patient moving all extremities  Post vital signs: Reviewed and stable  Last Vitals:  Filed Vitals:   06/17/15 0707  BP: 165/83  Pulse: 56  Temp: 36.6 C  Resp: 20    Complications: No apparent anesthesia complications

## 2015-06-17 NOTE — Final Progress Note (Signed)
No chances in his contion, surheru as planned

## 2015-06-17 NOTE — Op Note (Signed)
NAMNunzio Cory:  Cohenour, Tesean               ACCOUNT NO.:  [id number]  MEDICAL RECORD NO.:  [id number]  LOCATION:  [facility]                         FACILITY:  [facility]  PHYSICIAN:  Hilda LiasErnesto Sally-Anne Wamble, M.D.   DATE OF BIRTH:  01/09/1931  DATE OF PROCEDURE:  06/17/2015 DATE OF DISCHARGE:                              OPERATIVE REPORT   PREOPERATIVE DIAGNOSIS:  Recurrent L4-5, L5-S1 lumbar stenosis. Chronic radiculopathy.  POSTOPERATIVE DIAGNOSIS:  Recurrent L4-5, L5-S1 lumbar stenosis. Chronic radiculopathy.  PROCEDURE:  Bilateral L4-5 laminectomy, foraminotomy, lysis of adhesion mostly posterolateral to the right, microscope.  SURGEON:  Hilda LiasErnesto Jaquaya Coyle, M.D.  ASSISTANT:  Dr. Lovell SheehanJenkins.  INDICATION:  The patient is an 81 year old gentleman who [place] years [place].  We have no details about what type of surgery, but nevertheless the patient did well for a while and he came back now with worsening of the pain.  We did an MRI which was useless. Lumbar myelogram showed stenosis at the L4-5 and L5-S1.  The patient has intact L5 and some of the L4 lamina.  The foramina were quite narrow and he has a degenerative disk disease.  He had no mobility with flexion and extension views.  He and his family knew the risk of the surgery including the possibility of no improvement, need for further surgery, CSF leak, and infection.  DESCRIPTION OF PROCEDURE:  The patient was taken to the OR and after intubation, he was positioned in prone manner.  The back was cleaned first with Betadine and later on with DuraPrep.  Then, drapes were applied, and a midline incision following the previous one was made through the skin and subcutaneous tissue.  We identified immediately the spinal process of L5 and then the lower part of the process of L4 which was left behind.  From then on, we started dissection from one below going to the midline.  What we found in the midline was this patient had severe  fibrosis posterolaterally to the right going to the foramen. Because of that, we had to start using the microscope and with the help of the drill and the 1 or 2 mm Kerrison punch, we were able to do lysis of adhesions to free the L3, L4, and L5 nerve roots bilaterally.  The one which was the worse was the L5 on the left side.  At the end, we had plenty of room for the nerve roots as well as the thecal sac.  The disks were flat.  Valsalva maneuver was negative, but because we did quite a bit of dissection, we left some surgical wound.  From then on, the area was irrigated.  Muscle closed with different layers of Vicryl and the skin with staples.         ______________________________ Hilda LiasErnesto Ferry Matthis, M.D.    EB/MEDQ  D:  06/17/2015  T:  06/17/2015  Job:  [id number]

## 2015-06-17 NOTE — Anesthesia Postprocedure Evaluation (Signed)
Anesthesia Post Note  Patient: Melvin Strickland  Procedure(s) Performed: Procedure(s) (LRB): lumbar four-five,lumbar five sacral-one Foraminotomy (N/A)  Patient location during evaluation: PACU Anesthesia Type: General Level of consciousness: awake and alert, oriented, patient cooperative and awake Pain management: pain level controlled Vital Signs Assessment: post-procedure vital signs reviewed and stable Respiratory status: spontaneous breathing, nonlabored ventilation, respiratory function stable and patient connected to nasal cannula oxygen Cardiovascular status: blood pressure returned to baseline and stable Postop Assessment: no signs of nausea or vomiting Anesthetic complications: no    Last Vitals:  Filed Vitals:   06/17/15 1215 06/17/15 1230  BP: 136/70 143/72  Pulse: 60 62  Temp:  36.5 C  Resp: 17 24    Last Pain:  Filed Vitals:   06/17/15 1239  PainSc: 4                  Cecile HearingStephen Edward Turk

## 2015-06-17 NOTE — Progress Notes (Signed)
Pt has orders for iv PCA Morphine, yet to be started due to pt's allergy to morphine, currently on iv dilaudid for pain control, Dr Lovell SheehanJenkins (on call) paged to clarify orders, ordered to d/c the PCA Morphine same done, pt reassured, will however continue to monitor. Obasogie-Asidi, Supriya Beaston Efe

## 2015-06-17 NOTE — Progress Notes (Signed)
Utilization review completed.  L. J. Mella Inclan RN, BSN, CM 

## 2015-06-18 ENCOUNTER — Encounter (HOSPITAL_COMMUNITY): Payer: Self-pay | Admitting: Neurosurgery

## 2015-06-18 MED ORDER — FENTANYL 40 MCG/ML IV SOLN
INTRAVENOUS | Status: DC
  Administered 2015-06-18: 22:00:00 via INTRAVENOUS
  Administered 2015-06-19: 20 ug via INTRAVENOUS
  Administered 2015-06-19: 100 ug via INTRAVENOUS
  Administered 2015-06-19: 3.5 ug via INTRAVENOUS
  Filled 2015-06-18: qty 25

## 2015-06-18 MED ORDER — SODIUM CHLORIDE 0.9 % IJ SOLN: 9.0000 mL | INTRAMUSCULAR | Status: DC | PRN

## 2015-06-18 MED ORDER — ONDANSETRON HCL 4 MG/2ML IJ SOLN: 4.0000 mg | Freq: Four times a day (QID) | INTRAMUSCULAR | Status: DC | PRN

## 2015-06-18 MED ORDER — DIAZEPAM 5 MG PO TABS
10.0000 mg | ORAL_TABLET | Freq: Four times a day (QID) | ORAL | Status: DC | PRN
  Administered 2015-06-18 – 2015-06-20 (×4): 10 mg via ORAL
  Filled 2015-06-18 (×4): qty 2

## 2015-06-18 MED ORDER — DIPHENHYDRAMINE HCL 50 MG/ML IJ SOLN: 12.5000 mg | Freq: Four times a day (QID) | INTRAMUSCULAR | Status: DC | PRN

## 2015-06-18 MED ORDER — HEPARIN SODIUM (PORCINE) 5000 UNIT/ML IJ SOLN
5000.0000 [IU] | Freq: Three times a day (TID) | INTRAMUSCULAR | Status: DC
  Administered 2015-06-18 – 2015-06-26 (×23): 5000 [IU] via SUBCUTANEOUS
  Filled 2015-06-18 (×23): qty 1

## 2015-06-18 MED ORDER — NALOXONE HCL 0.4 MG/ML IJ SOLN: 0.4000 mg | INTRAMUSCULAR | Status: DC | PRN

## 2015-06-18 MED ORDER — DIPHENHYDRAMINE HCL 12.5 MG/5ML PO ELIX: 12.5000 mg | ORAL_SOLUTION | Freq: Four times a day (QID) | ORAL | Status: DC | PRN

## 2015-06-18 NOTE — Progress Notes (Signed)
Occupational Therapy Evaluation Patient Details Name: Melvin Strickland MRN: [id number] DOB: 05/22/1931 Today's Date: 06/18/2015    History of Present Illness Pt is an 81 y/o male who presents s/p L4-S1 laminectomy/decompression on 06/17/15.   Clinical Impression   PTA, pt was independent with all ADLs and mobility. Pt currently presents with acute back pain and limited pt's participation today. Pt required min-mod assist for functional mobility and ADLs. Pt reported a headache (7/10) throughout session. Pt will benefit from acute skilled OT to increase independence and safety with ADLs and mobility to allow for safe discharge home with assistance from family. Recommending HHOT for ADL retraining and DME recommendations listed below.    Follow Up Recommendations  Home health OT;Supervision/Assistance - 24 hour    Equipment Recommendations  3 in 1 bedside comode;Other (comment) (RW-2 wheeled)    Recommendations for Other Services       Precautions / Restrictions Precautions Precautions: Fall;Back Precaution Booklet Issued: Yes (comment) Precaution Comments: Handout was reviewed and pt was educated throughout functional mobility.  Required Braces or Orthoses: Spinal Brace Spinal Brace: Lumbar corset;Applied in sitting position Restrictions Weight Bearing Restrictions: No      Mobility Bed Mobility Overal bed mobility: Needs Assistance Bed Mobility: Rolling;Sidelying to Sit Rolling: Min assist Sidelying to sit: Mod assist;+2 for safety/equipment       General bed mobility comments: Instructed pt on log roll technique. Verbal cues for hand placement on bedrails. +2 necessary to support trunk to come to sitting position as pt had been on bedrest for 24 hours  Transfers Overall transfer level: Needs assistance Equipment used: Rolling walker (2 wheeled) Transfers: Sit to/from Stand Sit to Stand: Min assist;+2 physical assistance         General transfer comment: Min  assist for boost to stand and verbal cues for complete hip extension to avoid bending and hand placement on seated surfaces.     Balance Overall balance assessment: Needs assistance Sitting-balance support: No upper extremity supported;Feet supported Sitting balance-Leahy Scale: Poor Sitting balance - Comments: Mainly due to pain Postural control: Posterior lean;Right lateral lean Standing balance support: Bilateral upper extremity supported;During functional activity Standing balance-Leahy Scale: Poor                              ADL Overall ADL's : Needs assistance/impaired Eating/Feeding: Set up;Sitting   Grooming: Wash/dry hands;Wash/dry face;Set up;Sitting Grooming Details (indicate cue type and reason): educated on 2 cup method for oral care         Upper Body Dressing : Moderate assistance;Sitting Upper Body Dressing Details (indicate cue type and reason): to don back brace and hospital gown                         Vision Vision Assessment?: No apparent visual deficits   Perception     Praxis      Pertinent Vitals/Pain Pain Assessment: 0-10 Pain Score: 7  Pain Location: HA - consisten throughout session lying flat or standing; backl Pain Descriptors / Indicators: Headache;Operative site guarding Pain Intervention(s): Limited activity within patient's tolerance;Monitored during session;Repositioned;Premedicated before session     Hand Dominance Right   Extremity/Trunk Assessment Upper Extremity Assessment Upper Extremity Assessment: Generalized weakness   Lower Extremity Assessment Lower Extremity Assessment: Defer to PT evaluation   Cervical / Trunk Assessment Cervical / Trunk Assessment: Other exceptions Cervical / Trunk Exceptions: s/p surgery.    Communication Communication Communication:  No difficulties   Cognition Arousal/Alertness: Awake/alert Behavior During Therapy: Anxious Overall Cognitive Status: Within Functional  Limits for tasks assessed       Memory: Decreased recall of precautions             General Comments       Exercises       Shoulder Instructions      Home Living Family/patient expects to be discharged to:: Private residence Living Arrangements: Spouse/significant other;Children;Other relatives Available Help at Discharge: Family;Available 24 hours/day Type of Home: House Home Access: Stairs to enter [employer] CorporationEntrance Stairs-Number of Steps: 3   Home Layout: One level     Bathroom Shower/Tub: Walk-in [employer] consultantshower;Curtain   Bathroom Toilet: Standard Bathroom Accessibility: Yes   Home Equipment: Grab bars - tub/shower          Prior Functioning/Environment Level of Independence: Independent        Comments: Drives, likes to do yard work    OT Diagnosis: Generalized weakness;Acute pain   OT Problem List: Decreased strength;Decreased range of motion;Decreased activity tolerance;Impaired balance (sitting and/or standing);Decreased coordination;Decreased safety awareness;Decreased knowledge of precautions;Decreased knowledge of use of DME or AE;Pain   OT Treatment/Interventions: Self-care/ADL training;Therapeutic exercise;Energy conservation;DME and/or AE instruction;Therapeutic activities;Patient/family education;Balance training    OT Goals(Current goals can be found in the care plan section) Acute Rehab OT Goals Patient Stated Goal: to decrease pain OT Goal Formulation: With patient Time For Goal Achievement: 07/02/15 Potential to Achieve Goals: Good ADL Goals Pt Will Perform Lower Body Bathing: with supervision;with adaptive equipment;sitting/lateral leans;sit to/from stand Pt Will Perform Lower Body Dressing: with supervision;with adaptive equipment;sitting/lateral leans;sit to/from stand Pt Will Transfer to Toilet: with supervision;ambulating;bedside commode Pt Will Perform Toileting - Clothing Manipulation and hygiene: with supervision;with adaptive  equipment;sitting/lateral leans;sit to/from stand Pt Will Perform Tub/Shower Transfer: Shower transfer;with supervision;ambulating;3 in 1;rolling walker Additional ADL Goal #1: Pt/caregiver will independently don/doff back brace to increase independence with ADLs. Additional ADL Goal #2: Pt will demonstrate adherence to 3/3 back precautions to increase safety with ADLs.  OT Frequency: Min 3X/week   Barriers to D/C:            Co-evaluation PT/OT/SLP Co-Evaluation/Treatment: Yes Reason for Co-Treatment: Complexity of the patient's impairments (multi-system involvement);For patient/therapist safety PT goals addressed during session: Mobility/safety with mobility;Balance;Proper use of DME OT goals addressed during session: ADL's and self-care;Proper use of Adaptive equipment and DME      End of Session Equipment Utilized During Treatment: Gait belt;Rolling walker;Back brace Nurse Communication: Mobility status;Precautions  Activity Tolerance: Patient limited by pain Patient left: in chair;with call bell/phone within reach;with chair alarm set;with family/visitor present   Time: 1610-96041356-1427 OT Time Calculation (min): 31 min Charges:  OT General Charges $OT Visit: 1 Procedure OT Evaluation $Initial OT Evaluation Tier I: 1 Procedure G-Codes:    Nils PyleJulia Dierks Wach, OTR/L 06/18/2015, 3:17 PM Pager: [phone]

## 2015-06-18 NOTE — Evaluation (Addendum)
Physical Therapy Evaluation Patient Details Name: Melvin Strickland MRN: [id number] DOB: Mar 10, 1931 Today's Date: 06/18/2015   History of Present Illness  Pt is an 81 y/o male who presents s/p L4-S1 laminectomy/decompression on 06/17/15.  Clinical Impression  Pt admitted with above diagnosis. Pt currently with functional limitations due to the deficits listed below (see PT Problem List). At the time of PT eval pt was able to perform transfers and ambulation with min assist for balance and support. Pain was a limiting factor, and effected pt's ability to sit EOB without UE support. Pt reports headache consistently at a 7/10 throughout session. Pt will benefit from skilled PT to increase their independence and safety with mobility to allow discharge to the venue listed below.       Follow Up Recommendations Home health PT;Supervision for mobility/OOB    Equipment Recommendations  Rolling walker with 5" wheels;3in1 (PT)    Recommendations for Other Services       Precautions / Restrictions Precautions Precautions: Fall;Back Precaution Booklet Issued: Yes (comment) Precaution Comments: Handout was reviewed and pt was educated throughout functional mobility.  Required Braces or Orthoses: Spinal Brace Spinal Brace: Lumbar corset;Applied in sitting position Restrictions Weight Bearing Restrictions: No      Mobility  Bed Mobility Overal bed mobility: Needs Assistance Bed Mobility: Rolling;Sidelying to Sit Rolling: Min assist Sidelying to sit: Mod assist;+2 for safety/equipment       General bed mobility comments: Pt was instructed in log roll technique. VC's specifically to reach for bed rails and for hand placement throughout transition to sitting. +2 was helpful in elevating trunk to full sitting.   Transfers Overall transfer level: Needs assistance Equipment used: Rolling walker (2 wheeled) Transfers: Sit to/from Stand Sit to Stand: Min assist;+2 physical assistance          General transfer comment: Assist to power-up to full standing position. Pt was cued for hand placement on seated surface for safety.   Ambulation/Gait Ambulation/Gait assistance: Min assist Ambulation Distance (Feet): 125 Feet Assistive device: Rolling walker (2 wheeled) Gait Pattern/deviations: Step-through pattern;Decreased stride length;Trunk flexed Gait velocity: Decreased Gait velocity interpretation: Below normal speed for age/gender General Gait Details: VC's for improved posture and walker position close to pt's body. Pt required occasional min assist for balance support and walker management. Chair follow utilized.   Stairs            Wheelchair Mobility    Modified Rankin (Stroke Patients Only)       Balance Overall balance assessment: Needs assistance Sitting-balance support: Feet supported;No upper extremity supported Sitting balance-Leahy Scale: Poor Sitting balance - Comments: Mainly due to pain Postural control: Posterior lean;Right lateral lean Standing balance support: Bilateral upper extremity supported;During functional activity Standing balance-Leahy Scale: Poor                               Pertinent Vitals/Pain Pain Assessment: 0-10 Pain Score: 7  Pain Location: Headache - consistent throughout session lying flat or standing.  Pain Descriptors / Indicators: Operative site guarding;Headache Pain Intervention(s): Limited activity within patient's tolerance;Monitored during session;Repositioned    Home Living Family/patient expects to be discharged to:: Private residence Living Arrangements: Spouse/significant other;Children;Other relatives Available Help at Discharge: Family;Available 24 hours/day Type of Home: House Home Access: Stairs to enter   [employer] of Steps: 3 Home Layout: One level Home Equipment: None      Prior Function Level of Independence: Independent  Hand Dominance   Dominant  Hand: Right    Extremity/Trunk Assessment   Upper Extremity Assessment: Defer to OT evaluation           Lower Extremity Assessment: Generalized weakness (Appears R leg buckling but pt does not endorse this)      Cervical / Trunk Assessment: Other exceptions  Communication   Communication: No difficulties  Cognition Arousal/Alertness: Awake/alert Behavior During Therapy: Anxious Overall Cognitive Status: Within Functional Limits for tasks assessed       Memory: Decreased recall of precautions              General Comments      Exercises        Assessment/Plan    PT Assessment Patient needs continued PT services  PT Diagnosis Difficulty walking;Acute pain   PT Problem List Decreased strength;Decreased range of motion;Decreased activity tolerance;Decreased balance;Decreased mobility;Decreased knowledge of use of DME;Decreased safety awareness;Decreased knowledge of precautions;Pain  PT Treatment Interventions DME instruction;Gait training;Stair training;Functional mobility training;Therapeutic activities;Therapeutic exercise;Neuromuscular re-education;Patient/family education   PT Goals (Current goals can be found in the Care Plan section) Acute Rehab PT Goals Patient Stated Goal: Pain control PT Goal Formulation: With patient/family Time For Goal Achievement: 07/02/15 Potential to Achieve Goals: Good    Frequency Min 5X/week   Barriers to discharge        Co-evaluation               End of Session Equipment Utilized During Treatment: Gait belt;Back brace Activity Tolerance: Patient limited by pain Patient left: in chair;with call bell/phone within reach;with family/visitor present Nurse Communication: Mobility status      PT/OT Co-eval for pt/therapist safety and complexity of pt impairments.    Time: 1610-96041356-1429 PT Time Calculation (min) (ACUTE ONLY): 33 min   Charges:   PT Evaluation $Initial PT Evaluation Tier I: 1 Procedure    PT G  Codes:        Conni SlipperKirkman, Borden Thune 06/18/2015, 2:43 PM  Conni SlipperLaura Naydene Kamrowski, PT, DPT Acute Rehabilitation Services Pager: [phone]

## 2015-06-18 NOTE — Progress Notes (Signed)
Patient ID: Melvin DoorJohnny Limon, male   DOB: [birth year], 81 y.o.   MRN: [id number] Still complaining of incisional pain, no weakness. Unable to sleep wound dry, no sq fluid collection. See change in orders

## 2015-06-18 NOTE — Care Management Note (Signed)
Case Management Note  Patient Details  Name: Melvin DoorJohnny Strickland MRN: [id number] Date of Birth: August 02, 1930  Subjective/Objective:   Patient underwent a L4-S1 Foraminotomy. Patient is from home with his wife.                  Action/Plan: Currently patient is on bedrest. Awaiting PT/OT recommendations when able to see patient. CM will continue to follow for discharge needs.   Expected Discharge Date:                  Expected Discharge Plan:     In-House Referral:     Discharge planning Services     Post Acute Care Choice:    Choice offered to:     DME Arranged:    DME Agency:     HH Arranged:    HH Agency:     Status of Service:  In process, will continue to follow  Medicare Important Message Given:    Date Medicare IM Given:    Medicare IM give by:    Date Additional Medicare IM Given:    Additional Medicare Important Message give by:     If discussed at Long Length of Stay Meetings, dates discussed:    Additional Comments:  Kermit BaloKelli F Laritza Vokes, RN 06/18/2015, 11:05 AM

## 2015-06-18 NOTE — Progress Notes (Signed)
PT Cancellation Note  Patient Details Name: Melvin Strickland MRN: [id number] DOB: 1931-01-10   Cancelled Treatment:    Reason Eval/Treat Not Completed: Patient not medically ready. Pt with bedrest orders active until 1311. Will check back as schedule allows after this time to complete PT eval.    Conni SlipperKirkman, Harneet Noblett 06/18/2015, 9:54 AM

## 2015-06-18 NOTE — Progress Notes (Signed)
Patient ID: Melvin DoorJohnny Strickland, male   DOB: [birth year], 81 y.o.   MRN: [id number] Less pain than yesterday,no weakness. Headache when he moves his head but not with coughing   dry

## 2015-06-18 NOTE — Progress Notes (Signed)
Patient ID: Melvin DoorJohnny Cange, male   DOB: 04-09-1931, 81 y.o.   MRN: [id number] Resting well

## 2015-06-18 NOTE — Progress Notes (Signed)
Pt was unable to pass urine after several attempt, bladder scanned to the volume of greater than , pt 's bladder was drained, of clear urine drained out, pt made comfortable in bed with call light within reach, will however continue to monitor. Obasogie-Asidi, Bradd Merlos Efe

## 2015-06-19 NOTE — Clinical Social Work Note (Signed)
CSW received referral for SNF.  Case discussed with case manager, and plan is to discharge home with home health.  CSW to sign off please re-consult if social work needs arise.  Belford Pascucci R. Baylyn Sickles, MSW, LCSWA [phone]  

## 2015-06-19 NOTE — Progress Notes (Signed)
Patient ID: Melvin Strickland, male   DOB: Jul 20, 1930, 81 y.o.   MRN: [id number] Pain under control. No weakness. Less headache. No weakness. Wound dry

## 2015-06-19 NOTE — Progress Notes (Signed)
PT Cancellation Note  Patient Details Name: Melvin Strickland MRN: [id number] DOB: [birth year]   Cancelled Treatment:    Reason Eval/Treat Not Completed: Patient not medically ready. Discussed pt case with OT who states that pt had sudden onset of severe headache when transitioned to EOB. Will hold PT treatment at this time and wait until rounding physician has seen and cleared pt for further activity.    Conni SlipperKirkman, Ilina Xu 06/19/2015, 10:46 AM   Conni SlipperLaura Raelene Trew, PT, DPT Acute Rehabilitation Services Pager: [phone]

## 2015-06-19 NOTE — Progress Notes (Signed)
Bladder scan clear yellow urine. 3 previous in and out cath. Inserted foley catheter per MD order.

## 2015-06-19 NOTE — Progress Notes (Signed)
Occupational Therapy Treatment Patient Details Name: Melvin Strickland MRN: [id number] DOB: 1930-09-22 Today's Date: 06/19/2015    History of present illness Pt is an 81 y/o male who presents s/p L4-S1 laminectomy/decompression on 06/17/15.   OT comments  Session limited by onset of sudden and severe headache when sitting EOB and severe back pain. Attempted sit-stand transfers x2 with limited success. Laid pt back down to alleviate headache pain - pt stated his headache felt better after laying down ~5 minutes. Change follow up recommendations to SNF for post-acute rehab as pt's severe headache and low back pain are limiting his current ability to participate in therapy.     Follow Up Recommendations  SNF;Supervision/Assistance - 24 hour    Equipment Recommendations  3 in 1 bedside comode;Other (comment)    Recommendations for Other Services      Precautions / Restrictions Precautions Precautions: Fall;Back Precaution Booklet Issued: No Precaution Comments: Pt able to recall 1/3 back precautions at beginning of session. Reviewed precautions. Pt in too much pain to demonstrate good adherence to precautions Required Braces or Orthoses: Spinal Brace Spinal Brace: Lumbar corset;Applied in sitting position Restrictions Weight Bearing Restrictions: No       Mobility Bed Mobility Overal bed mobility: Needs Assistance Bed Mobility: Rolling;Sidelying to Sit;Sit to Sidelying Rolling: Mod assist Sidelying to sit: Max assist     Sit to sidelying: Max assist General bed mobility comments: Increased assistance needed today. Mod-max assist for trunk support and to progress LE on and off bed. Verbal cues for log roll technique and hand placement. Heavy use of bedrails, HOB flat.  Transfers Overall transfer level: Needs assistance Equipment used: Rolling walker (2 wheeled) Transfers: Sit to/from Stand Sit to Stand: Max assist         General transfer comment: x2 attempts with success  on 2nd attempt. Max assist for boost to stand and to maintain balance due to severe R lateral lean. Verbal cues to shift weight to midline but pt unable to achieve without physical assist. Verbal cues for safe hand placement on seated surfaces - pt dropped down to bed quickly without putting hand out due to pain    Balance Overall balance assessment: Needs assistance Sitting-balance support: Bilateral upper extremity supported;Feet supported Sitting balance-Leahy Scale: Poor Sitting balance - Comments: Heavy reliance on bilateral UE support to sit upright Postural control: Right lateral lean;Posterior lean Standing balance support: Bilateral upper extremity supported;During functional activity Standing balance-Leahy Scale: Poor Standing balance comment: severe R lateral lean                   ADL Overall ADL's : Needs assistance/impaired                 Upper Body Dressing : Total assistance;Sitting Upper Body Dressing Details (indicate cue type and reason): Unable to sit without bilateral UE support to don brace                   General ADL Comments: Attempted sit-stand to complete ADLs but pt had sudden severe headache and laid pt back down immediately.      Vision                     Perception     Praxis      Cognition   Behavior During Therapy: Anxious Overall Cognitive Status: Within Functional Limits for tasks assessed       Memory: Decreased recall of precautions  Extremity/Trunk Assessment               Exercises     Shoulder Instructions       General Comments      Pertinent Vitals/ Pain       Pain Assessment: 0-10 Pain Score: 8  Pain Location: back and HA upon sitting EOB Pain Descriptors / Indicators: Aching;Shooting;Operative site guarding;Headache Pain Intervention(s): Limited activity within patient's tolerance;Monitored during session;Repositioned;Premedicated before session  Home Living                                           Prior Functioning/Environment              Frequency Min 3X/week     Progress Toward Goals  OT Goals(current goals can now be found in the care plan section)  Progress towards OT goals: Not progressing toward goals - comment (Severe HA and low back pain preventing progress)  Acute Rehab OT Goals Patient Stated Goal: to decrease pain OT Goal Formulation: With patient Time For Goal Achievement: 07/02/15 Potential to Achieve Goals: Good ADL Goals Pt Will Perform Lower Body Bathing: with supervision;with adaptive equipment;sitting/lateral leans;sit to/from stand Pt Will Perform Lower Body Dressing: with supervision;with adaptive equipment;sitting/lateral leans;sit to/from stand Pt Will Transfer to Toilet: with supervision;ambulating;bedside commode Pt Will Perform Toileting - Clothing Manipulation and hygiene: with supervision;with adaptive equipment;sitting/lateral leans;sit to/from stand Pt Will Perform Tub/Shower Transfer: Shower transfer;with supervision;ambulating;3 in 1;rolling walker Additional ADL Goal #1: Pt/caregiver will independently don/doff back brace to increase independence with ADLs. Additional ADL Goal #2: Pt will demonstrate adherence to 3/3 back precautions to increase safety with ADLs.  Plan Discharge plan needs to be updated    Co-evaluation                 End of Session Equipment Utilized During Treatment: Gait belt;Rolling walker;Back brace   Activity Tolerance Patient limited by pain   Patient Left in bed;with call bell/phone within reach;with bed alarm set;with nursing/sitter in room   Nurse Communication Mobility status;Precautions;Other (comment) (Severe HA upon sitting up)        Time: 1610-96040857-0925 OT Time Calculation (min): 28 min  Charges: OT General Charges $OT Visit: 1 Procedure OT Treatments $Self Care/Home Management : 23-37 mins  Nils PyleJulia Theodor Mustin, OTR/L 06/19/2015, 9:48  AM Pager: [phone]

## 2015-06-19 NOTE — Progress Notes (Signed)
Educated pt on using PCA pump with verbalized understanding. With PCA pump infusing, pt states that PCA pump does not help with pain relief. Pt states valium works so had administered med with effective result.

## 2015-06-20 MED ORDER — DIAZEPAM 2 MG PO TABS
2.0000 mg | ORAL_TABLET | Freq: Four times a day (QID) | ORAL | Status: DC | PRN
  Administered 2015-06-22 – 2015-06-26 (×5): 2 mg via ORAL
  Filled 2015-06-20 (×5): qty 1

## 2015-06-20 MED ORDER — OXYCODONE HCL 5 MG PO TABS
5.0000 mg | ORAL_TABLET | ORAL | Status: DC | PRN
  Administered 2015-06-20 – 2015-06-26 (×15): 5 mg via ORAL
  Filled 2015-06-20 (×15): qty 1

## 2015-06-20 NOTE — Progress Notes (Signed)
Patient ID: Rosine DoorJohnny Balbach, male   DOB: [birth year], 81 y.o.   MRN: [id number] Sleepy,no headache. Dressing dry. See orders

## 2015-06-20 NOTE — Progress Notes (Signed)
Patient ID: Melvin DoorJohnny Strickland, male   DOB: 12/12/1930, 81 y.o.   MRN: [id number] More awake, less pain. Spoke with wife . Possible going home tomorrow pm or sunday

## 2015-06-20 NOTE — Care Management Important Message (Signed)
Important Message  Patient Details  Name: Rosine DoorJohnny Miggins MRN: [id number] Date of Birth: 03/29/1931   Medicare Important Message Given:  Yes    Naketa Daddario P Aaniya Sterba 06/20/2015, 2:21 PM

## 2015-06-20 NOTE — Progress Notes (Signed)
Physical Therapy Treatment Patient Details Name: Melvin DoorJohnny Strickland MRN: [id number] DOB: 04-03-1931 Today's Date: 06/20/2015    History of Present Illness Pt is an 81 y/o male who presents s/p L4-S1 laminectomy/decompression on 06/17/15.    PT Comments    Pt groggy and with poor mobility. Leaning left in both sitting and standing. Strength equal bilaterally to testing. Requiring 2 person assist for mobility and unsure if family is able to provide this. Therefore recommend ST-SNF. Nursing aware of above.  Follow Up Recommendations  SNF     Equipment Recommendations  Rolling walker with 5" wheels;3in1 (PT)    Recommendations for Other Services       Precautions / Restrictions Precautions Precautions: Fall;Back Required Braces or Orthoses: Spinal Brace Spinal Brace: Lumbar corset;Applied in sitting position Restrictions Weight Bearing Restrictions: No    Mobility  Bed Mobility Overal bed mobility: Needs Assistance Bed Mobility: Rolling;Sidelying to Sit Rolling: Min assist Sidelying to sit: Mod assist;+2 for safety/equipment       General bed mobility comments: Verbal cues. Assist to elevate trunk.  Transfers Overall transfer level: Needs assistance Equipment used: Rolling walker (2 wheeled) Transfers: Sit to/from Stand Sit to Stand: +2 physical assistance;Mod assist         General transfer comment: Verbal cues for hand placement and assist to bring hips up and correct left lateral lean  Ambulation/Gait Ambulation/Gait assistance: +2 safety/equipment;Mod assist Ambulation Distance (Feet): 30 Feet Assistive device: Rolling walker (2 wheeled) Gait Pattern/deviations: Step-through pattern;Decreased step length - right;Decreased step length - left;Shuffle Gait velocity: Decreased Gait velocity interpretation: Below normal speed for age/gender General Gait Details: Pt initially with left lateral lean that improved slightly toward end of amb. Pt also with lt knee flexed  and shortened step length left.   Stairs            Wheelchair Mobility    Modified Rankin (Stroke Patients Only)       Balance Overall balance assessment: Needs assistance Sitting-balance support: Bilateral upper extremity supported;Feet supported Sitting balance-Leahy Scale: Poor Sitting balance - Comments: Pt leaning left and required min to mod A to maintain midline Postural control: Left lateral lean Standing balance support: Bilateral upper extremity supported Standing balance-Leahy Scale: Poor Standing balance comment: Significant lt lateral lean                    Cognition Arousal/Alertness: Lethargic Behavior During Therapy: Flat affect Overall Cognitive Status: Impaired/Different from baseline Area of Impairment: Problem solving;Following commands     Memory: Decreased recall of precautions (recalled 2 of 3 precautions) Following Commands: Follows one step commands with increased time     Problem Solving: Slow processing;Decreased initiation General Comments: Pt groggy and per nurse received Valium at 3:00 AM    Exercises      General Comments        Pertinent Vitals/Pain      Home Living                      Prior Function            PT Goals (current goals can now be found in the care plan section) Progress towards PT goals: Not progressing toward goals - comment    Frequency  Min 5X/week    PT Plan Discharge plan needs to be updated    Co-evaluation             End of Session Equipment Utilized During Treatment: Gait belt;Back brace Activity  Tolerance: Patient limited by lethargy Patient left: in chair;with call bell/phone within reach;with chair alarm set     Time: 0902-0922 PT Time Calculation (min) (ACUTE ONLY): 20 min  Charges:  $Gait Training: 8-22 mins                    G Codes:      Melvin Strickland 01-Jul-2015, 9:30 AM [facility] PT [phone]

## 2015-06-21 MED ORDER — KETOROLAC TROMETHAMINE 15 MG/ML IJ SOLN
15.0000 mg | Freq: Four times a day (QID) | INTRAMUSCULAR | Status: AC
  Administered 2015-06-21 – 2015-06-26 (×20): 15 mg via INTRAVENOUS
  Filled 2015-06-21 (×20): qty 1

## 2015-06-21 NOTE — Progress Notes (Signed)
Pt offered several times throughout shift to get OOB, pt refused each time, also refused medications for constipation, stated he wanted to wait until later in the day, foley care was completed. A.M. Medication for c/o headache.

## 2015-06-21 NOTE — Progress Notes (Signed)
Dr.Pool returned page from patient's c/o of headache to the back of his head and neck; family at bedside; orders received to start Toradol; see orders.  Will continue to monitor.

## 2015-06-21 NOTE — Progress Notes (Signed)
Patient more alert today;participating in therapy out of bed; appetite somewhat improved today; continues to c/o headache on a scale of 1-10 with a score of 8 and severe to the back of his head; has been persistant post op.

## 2015-06-21 NOTE — Progress Notes (Signed)
Patient ID: Melvin Strickland, male   DOB: 07/13/1930, 81 y.o.   MRN: [id number] More awake, less lumbar pain. Minimal headache. Pt to help. Discharge tomorrow?

## 2015-06-21 NOTE — Progress Notes (Signed)
Physical Therapy Treatment Patient Details Name: Melvin Strickland MRN: [id number] DOB: 02-Sep-1930 Today's Date: 06/21/2015    History of Present Illness Pt is an 81 y/o male who presents s/p L4-S1 laminectomy/decompression on 06/17/15.    PT Comments    Patient reports severe headache for 2 days, back pain is manageable, knows he needs to get up to get better.  Improved physical performance today, MIN/MOD assist for transfers and mobility today, unsteady on feet for balance.  Limited most by headache pain, needs cues and assist for spinal precautions and don/doff of brace.  Patient continues with skilled PT need, may benefit from rehab stay prior to return home, will continue to assess.   Follow Up Recommendations  SNF     Equipment Recommendations  Rolling walker with 5" wheels;3in1 (PT)    Recommendations for Other Services       Precautions / Restrictions Precautions Precautions: Fall;Back Precaution Comments: Recalls precautions with cues, 1/3 independently Required Braces or Orthoses: Spinal Brace Spinal Brace: Lumbar corset;Applied in sitting position Restrictions Weight Bearing Restrictions: No    Mobility  Bed Mobility Overal bed mobility: Needs Assistance Bed Mobility: Rolling;Sidelying to Sit;Sit to Sidelying Rolling: Min guard Sidelying to sit: Min assist     Sit to sidelying: Min assist;Mod assist General bed mobility comments: Verbal cues. Assist to elevate trunk, bed flat, no rail.  Transfers Overall transfer level: Needs assistance Equipment used: Rolling walker (2 wheeled) Transfers: Sit to/from Stand Sit to Stand: Min assist         General transfer comment: Verbal cues for hand placement.  Ambulation/Gait Ambulation/Gait assistance: Min assist Ambulation Distance (Feet): 100 Feet Assistive device: Rolling walker (2 wheeled) Gait Pattern/deviations: Drifts right/left;Decreased stride length;Step-to pattern Gait velocity: Decreased   General  Gait Details: Gait improved with practice, initially steppage gait with R lateral lean.   Stairs Stairs: Yes Stairs assistance: Min assist Stair Management: Two rails Number of Stairs: 3    Wheelchair Mobility    Modified Rankin (Stroke Patients Only)       Balance Overall balance assessment: Needs assistance Sitting-balance support: Bilateral upper extremity supported Sitting balance-Leahy Scale: Fair Sitting balance - Comments: posterior lean tendency   Standing balance support: Bilateral upper extremity supported Standing balance-Leahy Scale: Poor Standing balance comment: MIN assist stand balance with walker                    Cognition Arousal/Alertness: Awake/alert Behavior During Therapy: WFL for tasks assessed/performed Overall Cognitive Status: Within Functional Limits for tasks assessed Area of Impairment: Problem solving     Memory: Decreased recall of precautions Following Commands: Follows one step commands with increased time     Problem Solving: Requires verbal cues;Requires tactile cues General Comments: More alert today, severe headache    Exercises      General Comments        Pertinent Vitals/Pain Pain Assessment: 0-10 Pain Score: 10-Worst pain ever Pain Location: Headache, back is 'tolerable' Pain Descriptors / Indicators: Aching;Constant Pain Intervention(s): Limited activity within patient's tolerance;Monitored during session;Patient requesting pain meds-RN notified    Home Living                      Prior Function            PT Goals (current goals can now be found in the care plan section) Acute Rehab PT Goals Patient Stated Goal: To stop his headache PT Goal Formulation: With patient/family Time For Goal Achievement: 07/02/15  Potential to Achieve Goals: Good Progress towards PT goals: Progressing toward goals    Frequency  Min 5X/week    PT Plan Discharge plan needs to be updated    Co-evaluation              End of Session Equipment Utilized During Treatment: Gait belt;Back brace Activity Tolerance: Patient tolerated treatment well;Patient limited by pain Patient left: in bed;with call bell/phone within reach;with bed alarm set;with SCD's reapplied     Time: 1335-1415 PT Time Calculation (min) (ACUTE ONLY): 40 min  Charges:  $Gait Training: 8-22 mins $Therapeutic Activity: 8-22 mins                    G CodesFreida Busman:      Laylonie Marzec L 06/21/2015, 2:34 PM

## 2015-06-22 MED ORDER — HYDROMORPHONE HCL 1 MG/ML IJ SOLN
0.5000 mg | INTRAMUSCULAR | Status: DC | PRN
  Administered 2015-06-22 – 2015-06-23 (×6): 1 mg via INTRAVENOUS
  Filled 2015-06-22 (×6): qty 1

## 2015-06-22 NOTE — Progress Notes (Signed)
Pt still complaints of headache not relieved by Toradol or pain medication administered. Pt requested for this nurse to page MD to see if something else could be ordered for headache. Pt stated that after receiving pain medication, he fell asleep but woke up in more pain. Spoke with Md instructed to administer Oxy IR, lay pt flat, and let him rest until the am. No noted distress. Will continue to monitor.

## 2015-06-22 NOTE — Progress Notes (Signed)
Subjective: Patient reports bad headache today, worse when up  Objective: Vital signs in last 24 hours: Temp:  [98 F (36.7 C)-98.4 F (36.9 C)] 98.4 F (36.9 C) (01/01 0936) Pulse Rate:  [56-107] 107 (01/01 0936) Resp:  [16-20] 18 (01/01 0936) BP: (110-145)/(58-77) 110/61 mmHg (01/01 0936) SpO2:  [95 %-99 %] 97 % (01/01 0936)  Intake/Output from previous day: 12/31 0701 - 01/01 0700 In: 240 [P.O.:240] Out: 500 [Urine:500] Intake/Output this shift: Total I/O In: -  Out: 600 [Urine:600]  Physical Exam: Dressing CDI.  Dressing flat.  Positional headache.  Legs without pain.  Lab Results: No results for input(s): WBC, HGB, HCT, PLT in the last 72 hours. BMET No results for input(s): NA, K, CL, CO2, GLUCOSE, BUN, CREATININE, CALCIUM in the last 72 hours.  Studies/Results: No results found.  Assessment/Plan: Observe today, will hold on discharge until headache better.    LOS: 5 days    Dorian HeckleSTERN,Fredric Slabach D, MD 06/22/2015, 10:10 AM

## 2015-06-22 NOTE — Progress Notes (Signed)
Pt continues to complaint of headache without relief unable to sit up in a chair at this time. Pt repositioned to side in bed. Pain medication administered, cold wash cloth applied. Md aware of headache. Will continue to monitor.

## 2015-06-22 NOTE — Clinical Social Work Note (Signed)
Clinical Social Work Assessment  Patient Details  Name: Rosine DoorJohnny Hora MRN: [id number] Date of Birth: [birth year]  Date of referral:  06/22/15               Reason for consult:  Facility Placement                Permission sought to share information with:    Permission granted to share information::  Yes, Verbal Permission Granted  Name::        Agency::  SNFs  Relationship::     Contact Information:     Housing/Transportation Living arrangements for the past 2 months:  Single Family Home Source of Information:  Patient Patient Interpreter Needed:  None Criminal Activity/Legal Involvement Pertinent to Current Situation/Hospitalization:  No - Comment as needed Significant Relationships:  Spouse Lives with:  Spouse Do you feel safe going back to the place where you live?  Yes Need for family participation in patient care:  No (Coment)  Care giving concerns:  [place] in [place] and states that he desires a facility in [place], [place].    Social Worker assessment / plan: Refer [place] to facilities in [place].  Employment status:  Retired Health and safety inspectornsurance information:    PT Recommendations:  Skilled Nursing Facility Information / Referral to community resources:  Skilled Nursing Facility  Patient/Family's Response to care: Looking forward to recovery in SNF.   Patient/Family's Understanding of and Emotional Response to Diagnosis, Current Treatment, and Prognosis: Patient feeling positive about recovery and return to baseline.   Emotional Assessment Appearance:  Appears older than stated age Attitude/Demeanor/Rapport:   (appropropirate) Affect (typically observed):  Accepting, Appropriate Orientation:  Oriented to Self, Oriented to Place, Oriented to  Time, Oriented to Situation Alcohol / Substance use:  Not Applicable Psych involvement (Current and /or in the community):  No (Comment)  Discharge Needs  Concerns to be addressed:  No discharge needs  identified Readmission within the last 30 days:    Current discharge risk:  None Barriers to Discharge:  No Barriers Identified   Halana Deisher, Harrel LemonYWAN J, LCSW 06/22/2015, 5:14 PM

## 2015-06-22 NOTE — Progress Notes (Signed)
Called on call MD regarding no change in headache from earlier pain medicine administered. MD ordered dilaudid Q3 and to keep monitoring headache. Suzy Bouchardhompson, Demarkis Gheen E, [place] 06/22/2015 2047

## 2015-06-23 ENCOUNTER — Inpatient Hospital Stay (HOSPITAL_COMMUNITY): Payer: Medicare PPO

## 2015-06-23 NOTE — Progress Notes (Signed)
Patient ID: Rosine DoorJohnny Mathison, male   DOB: Mar 05, 1931, 82 y.o.   MRN: [id number] C/o headcahe non positional. Wound dry. Waiting for ct head  Lumbar. No weakness

## 2015-06-23 NOTE — Clinical Social Work Note (Signed)
CSW met with patient and family to discuss bed offers.  Patient and family would like to either take patient home with home health or have him go to [facility] in [place].  CSW contacted [facility] to inform them that patient would like to go to SNF and they will begin insurance authorization.  CSW to continue to follow patient's progress.  Jones Broom. Candor, MSW, [place] 06/23/2015 6:54 PM

## 2015-06-23 NOTE — Progress Notes (Signed)
Physical Therapy Treatment Patient Details Name: Melvin DoorJohnny Strickland MRN: [id number] DOB: [birth year] Today's Date: 06/23/2015    History of Present Illness Pt is an 82 y/o male who presents s/p L4-S1 laminectomy/decompression on 06/17/15. Pt with headache since surgery.    PT Comments    Pt with much improved mobility. Still complaining of headache especially when up.  Follow Up Recommendations  SNF     Equipment Recommendations  Rolling walker with 5" wheels;3in1 (PT)    Recommendations for Other Services       Precautions / Restrictions Precautions Precautions: Fall;Back Required Braces or Orthoses: Spinal Brace Spinal Brace: Lumbar corset;Applied in sitting position Restrictions Weight Bearing Restrictions: No    Mobility  Bed Mobility Overal bed mobility: Needs Assistance Bed Mobility: Rolling;Sidelying to Sit;Sit to Sidelying Rolling: Min guard Sidelying to sit: Min assist       General bed mobility comments: Assist to elevate trunk.  Transfers Overall transfer level: Needs assistance Equipment used: Rolling walker (2 wheeled) Transfers: Sit to/from Stand Sit to Stand: Min assist         General transfer comment: Verbal cues for hand placement.  Ambulation/Gait Ambulation/Gait assistance: Min assist Ambulation Distance (Feet): 175 Feet Assistive device: Rolling walker (2 wheeled) Gait Pattern/deviations: Step-through pattern;Decreased stride length Gait velocity: Decreased Gait velocity interpretation: <1.8 ft/sec, indicative of risk for recurrent falls General Gait Details: Verbal cues to stand more erect.   Stairs            Wheelchair Mobility    Modified Rankin (Stroke Patients Only)       Balance   Sitting-balance support: No upper extremity supported;Feet supported Sitting balance-Leahy Scale: Fair Sitting balance - Comments: Pt able to maintain midline without difficulty   Standing balance support: Bilateral upper extremity  supported Standing balance-Leahy Scale: Poor Standing balance comment: support of walker and min A for static standing.                    Cognition Arousal/Alertness: Awake/alert Behavior During Therapy: WFL for tasks assessed/performed Overall Cognitive Status: Within Functional Limits for tasks assessed                      Exercises      General Comments        Pertinent Vitals/Pain Pain Assessment: 0-10 Pain Score: 8  Pain Location: Headache worse when up  Pain Descriptors / Indicators: Aching Pain Intervention(s): Limited activity within patient's tolerance;Monitored during session    Home Living                      Prior Function            PT Goals (current goals can now be found in the care plan section) Progress towards PT goals: Progressing toward goals    Frequency  Min 3X/week    PT Plan Current plan remains appropriate;Frequency needs to be updated    Co-evaluation             End of Session Equipment Utilized During Treatment: Gait belt;Back brace Activity Tolerance: Patient tolerated treatment well Patient left: with call bell/phone within reach;in chair;with chair alarm set;with family/visitor present     Time: [id number] PT Time Calculation (min) (ACUTE ONLY): 16 min  Charges:  $Gait Training: 8-22 mins                    G Codes:      Melvin Strickland 06/23/2015, 3:09  PM [employer] PT [phone]

## 2015-06-23 NOTE — Progress Notes (Signed)
Pt's wife is requesting that the patient go to a SNF closer to where they live.  [facility] [place].  Will continue to monitor. Sondra ComeSilva, Darell Saputo M, RN

## 2015-06-23 NOTE — Progress Notes (Signed)
Patient continues to have severe headache. Plan is to CT scan his head and spine. Plan continues to be SNF at discharge. CM will continue to follow for discharge needs.

## 2015-06-23 NOTE — [place] FL2 (Signed)
[place] MEDICAID FL2 LEVEL OF CARE SCREENING TOOL     IDENTIFICATION  Patient Name: Melvin DoorJohnny Dembeck Birthdate: 1930-10-03 Sex: male Admission Date (Current Location): 06/17/2015  [facility] [place]:      Facility and Address:  [facility] [place], [address], [place], [place] [address]      Provider Number: [id number]  Attending Physician Name and Address:  Hilda LiasErnesto Botero, MD  Relative Name and Phone Number:  Sharia ReeveMaryann Harpole,Spouse [phone]    Current Level of Care: Hospital Recommended Level of Care: Skilled Nursing Facility Prior Approval Number:    Date Approved/Denied:   PASRR Number:  [id number]    Discharge Plan: SNF    Current Diagnoses: Patient Active Problem List   Diagnosis Date Noted  . Lumbar stenosis with neurogenic claudication 06/17/2015    Orientation RESPIRATION BLADDER Height & Weight    Self, Time, Situation, Place  Normal Continent 5\' 3"  (160 cm) 150 lbs.  BEHAVIORAL SYMPTOMS/MOOD NEUROLOGICAL BOWEL NUTRITION STATUS      Continent Diet (Regular Diet)  AMBULATORY STATUS COMMUNICATION OF NEEDS Skin   Limited Assist Verbally Surgical wounds                       Personal Care Assistance Level of Assistance  Bathing, Dressing Bathing Assistance: Limited assistance   Dressing Assistance: Limited assistance     Functional Limitations Info             SPECIAL CARE FACTORS FREQUENCY  PT (By licensed PT)     PT Frequency: 5x a week              Contractures      Additional Factors Info  Code Status, Allergies Code Status Info: Full Code Allergies Info: MORPHINE AND RELATED           Current Medications (06/23/2015):  This is the current hospital active medication list Current Facility-Administered Medications  Medication Dose Route Frequency Provider Last Rate Last Dose  . 0.9 %  sodium chloride infusion  250 mL Intravenous Continuous Hilda LiasErnesto Botero, MD   250 mL at 06/17/15 1311  .  0.9 %  sodium chloride infusion   Intravenous Continuous Hilda LiasErnesto Botero, MD 75 mL/hr at 06/20/15 2244 75 mL/hr at 06/20/15 2244  . acetaminophen (TYLENOL) tablet 650 mg  650 mg Oral Q4H PRN Hilda LiasErnesto Botero, MD   650 mg at 06/21/15 1427   Or  . acetaminophen (TYLENOL) suppository 650 mg  650 mg Rectal Q4H PRN Hilda LiasErnesto Botero, MD      . alum & mag hydroxide-simeth (MAALOX/MYLANTA) 200-200-20 MG/5ML suspension 30 mL  30 mL Oral PRN Hilda LiasErnesto Botero, MD   30 mL at 06/21/15 0917  . amiodarone (PACERONE) tablet 100 mg  100 mg Oral Q M,W,F Hilda LiasErnesto Botero, MD   100 mg at 06/20/15 1134  . aspirin EC tablet 162 mg  162 mg Oral Daily Hilda LiasErnesto Botero, MD   162 mg at 06/22/15 0944  . diazepam (VALIUM) tablet 2 mg  2 mg Oral Q6H PRN Hilda LiasErnesto Botero, MD   2 mg at 06/22/15 1255  . heparin injection 5,000 Units  5,000 Units Subcutaneous 3 times per day Hilda LiasErnesto Botero, MD   5,000 Units at 06/23/15 0536  . HYDROmorphone (DILAUDID) injection 0.5-1 mg  0.5-1 mg Intravenous Q3H PRN Maeola HarmanJoseph Stern, MD   1 mg at 06/23/15 0608  . ketorolac (TORADOL) 15 MG/ML injection 15 mg  15 mg Intravenous 4 times per day Julio SicksHenry Pool,  MD   15 mg at 06/23/15 0536  . lactated ringers infusion   Intravenous Continuous Cecile Hearing, MD      . lisinopril (PRINIVIL,ZESTRIL) tablet 10 mg  10 mg Oral QAC breakfast Hilda Lias, MD   10 mg at 06/23/15 9604  . magnesium hydroxide (MILK OF MAGNESIA) suspension 30 mL  30 mL Oral Daily PRN Hilda Lias, MD      . menthol-cetylpyridinium (CEPACOL) lozenge 3 mg  1 lozenge Oral PRN Hilda Lias, MD       Or  . phenol (CHLORASEPTIC) mouth spray 1 spray  1 spray Mouth/Throat PRN Hilda Lias, MD      . ondansetron [facility]) injection 4 mg  4 mg Intravenous Q4H PRN Hilda Lias, MD   4 mg at 06/17/15 2030  . oxyCODONE (Oxy IR/ROXICODONE) immediate release tablet 5 mg  5 mg Oral Q4H PRN Hilda Lias, MD   5 mg at 06/22/15 1852  . rosuvastatin (CRESTOR) tablet 10 mg  10 mg Oral Daily Hilda Lias, MD   10 mg at 06/22/15 0944  . sodium chloride 0.9 % injection 3 mL  3 mL Intravenous Q12H Hilda Lias, MD   3 mL at 06/21/15 2151  . sodium chloride 0.9 % injection 3 mL  3 mL Intravenous PRN Hilda Lias, MD         Discharge Medications: Please see discharge summary for a list of discharge medications.  Relevant Imaging Results:  Relevant Lab Results:   Additional Information Patient from [place] wants SNF in [place], [place] [phone]  Olson Lucarelli, Ervin Knack, [place]

## 2015-06-23 NOTE — Clinical Social Work Note (Signed)
Patient needs a [facility] number before he is able to discharge to a SNF, [facility] is closed today and CSW will not be able to get [facility] number until tomorrow.  CSW to continue to follow patient's progress throughout discharge planning.  Blake Vetrano R. Ayannah Faddis, MSW, LCSWA [phone] 06/23/2015 1:02 PM  

## 2015-06-23 NOTE — Progress Notes (Signed)
Patient ID: Melvin Strickland, male   DOB: March 19, 1931, 82 y.o.   MRN: [id number] Vital signs are stable Motor function is intact Patient complains severely of headache This is been going on since surgery Incision appears clean and dry L obtain a CT of the head to rule out subdural collection there also CT of the lumbar spine rule out any abnormality in the lumbar region

## 2015-06-24 NOTE — Progress Notes (Signed)
Occupational Therapy Treatment Patient Details Name: Melvin Strickland MRN: [id number] DOB: 10/13/1930 Today's Date: 06/24/2015    History of present illness Pt is an 82 y/o male who presents s/p L4-S1 laminectomy/decompression on 06/17/15. Pt with headache since surgery.   OT comments  Pt making slow progress towards goals.  Pt reports decrease in headache upon awaking from nap a few mins before OT arrival.  Pt with no increase in headache with bed mobility and sitting EOB.  Mod assist to elevate trunk from sidelying into sitting.  Min assist sit>stand and stand pivot transfer with cues for hand placement to increase safety and adhere to back precautions.  Educated on AE to assist with LB dressing with pt demonstrating ability to doff/don socks with min assist.  Pt reporting increase in headache once up and post transfer, however encouraged to sit up in recliner for 30 mins as he tolerates.    Follow Up Recommendations  SNF;Supervision/Assistance - 24 hour    Equipment Recommendations  3 in 1 bedside comode    Recommendations for Other Services      Precautions / Restrictions Precautions Precautions: Fall;Back Precaution Comments: unable to recall any precautions with or without cues Required Braces or Orthoses: Spinal Brace Spinal Brace: Lumbar corset;Applied in sitting position       Mobility Bed Mobility Overal bed mobility: Needs Assistance Bed Mobility: Rolling;Sidelying to Sit Rolling: Min guard Sidelying to sit: Mod assist       General bed mobility comments: Assist to elevate trunk.  Transfers Overall transfer level: Needs assistance Equipment used: Rolling walker (2 wheeled) Transfers: Sit to/from Stand Sit to Stand: Min assist         General transfer comment: Verbal cues for hand placement.        ADL Overall ADL's : Needs assistance/impaired                 Upper Body Dressing : Total assistance;Sitting Upper Body Dressing Details (indicate cue  type and reason): Unable to sit without bilateral UE support to don brace Lower Body Dressing: Cueing for safety;With adaptive equipment;Cueing for back precautions;Sitting/lateral leans;Minimal assistance Lower Body Dressing Details (indicate cue type and reason): educated on doffing/donning socks with reacher and sock aid Toilet Transfer: BSC;Stand-pivot;RW;Minimal assistance           Functional mobility during ADLs: Minimal assistance;Rolling walker General ADL Comments: Min assist sit > stand and stand pivot transfer with RW, pt required cues for hand placement with sit > stand and cues for back precautions with LB dressing tasks.  Pt with increase in headache during activity but able to tolerate increased OOB activity       Cognition   Behavior During Therapy: WFL for tasks assessed/performed Overall Cognitive Status: Within Functional Limits for tasks assessed       Memory: Decreased recall of precautions               Extremity/Trunk Assessment              Exercises     Shoulder Instructions       General Comments      Pertinent Vitals/ Pain       Pain Assessment: 0-10 Pain Score: 3  Pain Location: headache, increasing with OOB activity Pain Intervention(s): Limited activity within patient's tolerance;Monitored during session;Repositioned         Frequency Min 3X/week     Progress Toward Goals  OT Goals(current goals can now be found in the care plan section)  Progress towards OT goals: Progressing toward goals     Plan Discharge plan remains appropriate       End of Session Equipment Utilized During Treatment: Rolling walker;Back brace   Activity Tolerance Patient limited by pain   Patient Left in chair;with call bell/phone within reach;with bed alarm set   Nurse Communication Mobility status;Other (comment) (headache)        Time: 4098-11911405-1425 OT Time Calculation (min): 20 min  Charges: OT General Charges $OT Visit: 1  Procedure OT Treatments $Self Care/Home Management : 8-22 mins  Rosalio LoudHOXIE, Donaven Criswell, [phone] 06/24/2015, 2:33 PM

## 2015-06-24 NOTE — Care Management Important Message (Signed)
Important Message  Patient Details  Name: Melvin Strickland MRN: [id number] Date of Birth: 1930/09/04   Medicare Important Message Given:  Yes    Macaulay Reicher P Alyvia Derk 06/24/2015, 2:29 PM

## 2015-06-24 NOTE — Clinical Social Work Note (Signed)
[facility] information currently pending. [place] [facility] [place] [facility] if [facility] is not obtained in timely manner.   Patient [facility] however currently facility does NOT have male bed available. CSW to confirm male bed at facility on 1/4. Facility has started insurance auth and will take LOG if insurance Berkley Harveyauth is not obtained at discharge.   Pt currently not medically stable per MD. CSW remains available as needed.   Derenda FennelBashira Siera [place]f, MSW, LCSWA [phone] 06/24/2015 8:12 PM

## 2015-06-24 NOTE — Progress Notes (Signed)
Physical Therapy Treatment Patient Details Name: Rosine DoorJohnny Beeck MRN: [id number] DOB: [birth year] Today's Date: 06/24/2015    History of Present Illness Pt is an 82 y/o male who presents s/p L4-S1 laminectomy/decompression on 06/17/15. Pt with headache since surgery.    PT Comments    Patient progressing with ambulation distance and independence with bed mobility as well as able to perform correct technique without cues.  Still limited by pain and endurance appropriate for SNF level rehab.  Follow Up Recommendations  SNF     Equipment Recommendations  Rolling walker with 5" wheels;3in1 (PT)    Recommendations for Other Services       Precautions / Restrictions Precautions Precautions: Fall;Back Precaution Comments: unable to recall any precautions with or without cues Required Braces or Orthoses: Spinal Brace Spinal Brace: Lumbar corset;Applied in sitting position    Mobility  Bed Mobility Overal bed mobility: Needs Assistance Bed Mobility: Rolling;Sidelying to Sit Rolling: Supervision Sidelying to sit: Min assist;HOB elevated     Sit to sidelying: Supervision General bed mobility comments: Assist to elevate trunk, use of rail to roll and performed without cues  Transfers Overall transfer level: Needs assistance Equipment used: Rolling walker (2 wheeled) Transfers: Sit to/from Stand Sit to Stand: Min assist         General transfer comment: needs cues for safety  Ambulation/Gait Ambulation/Gait assistance: Min guard Ambulation Distance (Feet): 200 Feet Assistive device: Rolling walker (2 wheeled) Gait Pattern/deviations: Step-through pattern;Trunk flexed;Decreased stride length     General Gait Details: reports walker doesn't roll right, increased time and space needed for turns   Information systems managertairs            Wheelchair Mobility    Modified Rankin (Stroke Patients Only)       Balance     Sitting balance-Leahy Scale: Good     Standing balance support:  Bilateral upper extremity supported Standing balance-Leahy Scale: Poor Standing balance comment: static balance with walker and supervision, min A for dynamic with side stepping to [facility] with walker                    Cognition Arousal/Alertness: Awake/alert Behavior During Therapy: WFL for tasks assessed/performed Overall Cognitive Status: Within Functional Limits for tasks assessed       Memory: Decreased recall of precautions              Exercises      General Comments        Pertinent Vitals/Pain Pain Assessment: Faces Pain Score: 3  Faces Pain Scale: Hurts little more Pain Location: L hip Pain Descriptors / Indicators: Aching Pain Intervention(s): Repositioned;Patient requesting pain meds-RN notified    Home Living                      Prior Function            PT Goals (current goals can now be found in the care plan section) Progress towards PT goals: Progressing toward goals    Frequency  Min 3X/week    PT Plan Current plan remains appropriate    Co-evaluation             End of Session Equipment Utilized During Treatment: Back brace Activity Tolerance: Patient tolerated treatment well Patient left: in bed;with call bell/phone within reach;with nursing/sitter in room     Time: 1600-1620 PT Time Calculation (min) (ACUTE ONLY): 20 min  Charges:  $Gait Training: 8-22 mins  G CodesElray Mcgregor 06/24/2015, 4:25 PM  Sheran Lawless,  [phone] 06/24/2015

## 2015-06-24 NOTE — Progress Notes (Signed)
Patient ID: Rosine DoorJohnny Menges, male   DOB: Oct 25, 1930, 82 y.o.   MRN: [id number] Off and on headache. Wound dry. No weakness. Ct heah and lumbar seen. Seroma in the lumbar ares. Will continue to observe. No need to drain it

## 2015-06-25 MED ORDER — TAMSULOSIN HCL 0.4 MG PO CAPS
0.4000 mg | ORAL_CAPSULE | Freq: Every day | ORAL | Status: DC
  Administered 2015-06-25 – 2015-06-26 (×2): 0.4 mg via ORAL
  Filled 2015-06-25 (×2): qty 1

## 2015-06-25 MED ORDER — FLEET ENEMA 7-19 GM/118ML RE ENEM
1.0000 | ENEMA | Freq: Every day | RECTAL | Status: DC | PRN
  Administered 2015-06-25: 1 via RECTAL
  Filled 2015-06-25: qty 1

## 2015-06-25 NOTE — Progress Notes (Signed)
CM spoke with CSW, who states that patient's wife is interested in discharge home with home health.  CM attempted to reach Dr Jeral FruitBotero. Message was left with Dr Cassandria SanteeBotero's secretary requesting HH orders, if appropriate. CM will continue to follow.  Elmer Balesourtney Uva Runkel RN, MSN [phone]

## 2015-06-25 NOTE — Care Management Note (Signed)
Case Management Note  Patient Details  Name: Brandis Matsuura MRN: [id number] Date of Birth: 04-26-1931  Subjective/Objective:                    Action/Plan: CM spoke with Dr Joya Salm about patient and wife's desire to be discharged home with home health.  Dr Joya Salm requested that orders be placed and home health be arranged.  CM met with patient to verify the discharge plan. Patient gave permission for CM to call wife to select agency and finalize discharge plans.  CM attempted to reach wife via home phone and cellphone, no voicemail option was available.  CM will continue to follow with attempts to reach patient's wife to make home health arrangements.  Bedside RN aware. Expected Discharge Date:                  Expected Discharge Plan:  [place]  In-House Referral:     Discharge planning Services  CM Consult  Post Acute Care Choice:  Durable Medical Equipment, Home Health Choice offered to:     DME Arranged:    DME Agency:     HH Arranged:    [place] Agency:     Status of Service:  In process, will continue to follow  Medicare Important Message Given:  Yes Date Medicare IM Given:    Medicare IM give by:    Date Additional Medicare IM Given:    Additional Medicare Important Message give by:     If discussed at [place] of Stay Meetings, dates discussed:    Additional CommentsRolm Baptise, RN 06/25/2015, 3:02 PM [phone]

## 2015-06-25 NOTE — Clinical Social Work Note (Signed)
CSW spoke with patient and his family, they would prefer patient have home with home health.  CSW notified case manager who will make arrangements and contact physician.  CSW to sign off please reconsult if other social work needs arise.  Ervin KnackEric R. Draylen Lobue, MSW, LCSWA [phone] 06/25/2015 11:49 AM

## 2015-06-25 NOTE — Progress Notes (Signed)
Attempted to get patient OOB. Pt refused. Will continue to monitor and pass information on to day shift. Gara KronerHayles, Kassidee Narciso M, RN

## 2015-06-25 NOTE — Progress Notes (Signed)
Patient ID: Melvin DoorJohnny Strickland, male   DOB: 09-30-1930, 82 y.o.   MRN: [id number] C/o headache but in the right side. Wound dry foley in place. C/o constipation

## 2015-06-25 NOTE — Progress Notes (Signed)
Occupational Therapy Treatment Patient Details Name: Melvin Strickland MRN: [id number] DOB: [birth year] Today's Date: 06/25/2015    History of present illness Pt is an 82 y/o male who presents s/p L4-S1 laminectomy/decompression on 06/17/15. Pt with headache since surgery.   OT comments  Upon entering the room, pt required coaxing for participation with OT intervention this session but eventually agreeable. Pt making progress towards recall of back precautions. Pt requiring increased cues in order to maintain with movement. Pt donning back brace while seated on EOB but needing verbal cues for proper technique. Pt needing mod cues throughout session for hand placement and proper technique for safety. He declined to sit in recliner chair at end of session and was returned to bed. Call bell and bed alarm activated for safety.   Follow Up Recommendations  SNF;Supervision/Assistance - 24 hour    Equipment Recommendations  3 in 1 bedside comode    Recommendations for Other Services      Precautions / Restrictions Precautions Precautions: Fall;Back Precaution Booklet Issued: No Precaution Comments: able to recall 2/3 back precautions Required Braces or Orthoses: Spinal Brace Spinal Brace: Lumbar corset;Applied in sitting position Restrictions Weight Bearing Restrictions: No       Mobility Bed Mobility Overal bed mobility: Needs Assistance Bed Mobility: Rolling;Supine to Sit   Sidelying to sit: Min assist     Sit to sidelying: Supervision General bed mobility comments: Assist to elevate trunk, use of rail to roll and performed with cues for proper technique  Transfers Overall transfer level: Needs assistance Equipment used: Rolling walker (2 wheeled) Transfers: Sit to/from Stand Sit to Stand: Min assist         General transfer comment: needs cues for safety    Balance Overall balance assessment: Needs assistance         Standing balance support: Bilateral upper extremity  supported;During functional activity Standing balance-Leahy Scale: Poor                     ADL Overall ADL's : Needs assistance/impaired                         Toilet Transfer: Stand-pivot;Comfort height toilet;Ambulation;RW           Functional mobility during ADLs: Minimal assistance;Rolling walker General ADL Comments: Pt performed supine >sit with min A and mod verbal cues for proper technique in order to maintain back precautions. Pt donning back brace with min verbal cues for proper technique while seated on EOB. Pt ambulated 10' into bathroom with RW and min A for safety. Min A for transfer onto elevated toilet seat with cues for hand placement and safety. Pt requiring rest break secondary to fatigue before returning to bed. Pt refused sitting in recliner chair and reports, "I am so tired. I just want to go back to bed."                Cognition   Behavior During Therapy: [facility] for tasks assessed/performed Overall Cognitive Status: Within Functional Limits for tasks assessed       Memory: Decreased recall of precautions                            Pertinent Vitals/ Pain       Pain Assessment: 0-10 Pain Score: 6  Pain Location: head  Pain Descriptors / Indicators: Aching Pain Intervention(s): Limited activity within patient's tolerance;Monitored during session;Repositioned  Frequency Min 3X/week     Progress Toward Goals  OT Goals(current goals can now be found in the care plan section)  Progress towards OT goals: Progressing toward goals     Plan Discharge plan remains appropriate       End of Session Equipment Utilized During Treatment: Rolling walker;Back brace   Activity Tolerance Patient limited by pain   Patient Left in bed;with call bell/phone within reach;with bed alarm set           Time: 0910-0928 OT Time Calculation (min): 18 min  Charges: OT General Charges $OT Visit: 1 Procedure OT  Treatments $Self Care/Home Management : 8-22 mins  Lowella Gripittman, Naylene Foell L, MS, OTR/L 06/25/2015, 9:38 AM

## 2015-06-26 NOTE — Progress Notes (Signed)
Physical Therapy Treatment Patient Details Name: Melvin Strickland MRN: [id number] DOB: [birth year] Today's Date: 06/26/2015    History of Present Illness Pt is an 82 y/o male who presents s/p L4-S1 laminectomy/decompression on 06/17/15. Pt with headache since surgery.    PT Comments    Pt is now to d/c home with HHPT instead of SNF as originally recommended per patient/family request. No family present during this session, but pt denies any concerns in regards to d/c home. Reviewed back precautions, donning/doffing LSO (requires assistance by therapist), gait with RW, stair negotiation for home entry training, bed mobility, and transfers. Recommending follow up HHPT and use of RW for decreased fall risk and increased independence with mobility. Pt overall supervision to steady assist with mobility.    Follow Up Recommendations  Home health PT;Supervision for mobility/OOB     Equipment Recommendations  Rolling walker with 5" wheels;3in1 (PT)    Recommendations for Other Services       Precautions / Restrictions Precautions Precautions: Fall;Back Precaution Comments: Reviewed back precautions; pt requires cues to adhere during mobility Required Braces or Orthoses: Spinal Brace Spinal Brace: Lumbar corset;Applied in sitting position Restrictions Weight Bearing Restrictions: No    Mobility  Bed Mobility Overal bed mobility: Needs Assistance Bed Mobility: Supine to Sit;Sit to Sidelying     Supine to sit: Supervision   Sit to sidelying: Supervision General bed mobility comments: verbal cues for adhering to back precautions with bed mobility  Transfers Overall transfer level: Needs assistance Equipment used: Rolling walker (2 wheeled) Transfers: Sit to/from [employer] Sit to Stand: Min guard Stand pivot transfers: Min guard       General transfer comment: verbal cues for hand placement and technique  Ambulation/Gait Ambulation/Gait assistance:  Supervision;Min guard Ambulation Distance (Feet): 150 Feet (x2) Assistive device: Rolling walker (2 wheeled) Gait Pattern/deviations: Step-through pattern;Trunk flexed;Decreased stride length     General Gait Details: cues for upright posture; no noted LOB. Responded well to cues for postioning of RW with hips between hands.   Stairs Stairs: Yes Stairs assistance: Min guard Stair Management: Two rails;Step to pattern;Alternating pattern Number of Stairs: 4 General stair comments: steady assist intermittenly for balance. First trial of stairs  Wheelchair Mobility    Modified Rankin (Stroke Patients Only)       Balance Overall balance assessment: Needs assistance   Sitting balance-Leahy Scale: Good     Standing balance support: Single extremity supported;During functional activity;Bilateral upper extremity supported Standing balance-Leahy Scale: Fair                      Cognition Arousal/Alertness: Awake/alert Behavior During Therapy: WFL for tasks assessed/performed Overall Cognitive Status: Within Functional Limits for tasks assessed       Memory: Decreased recall of precautions Following Commands: Follows multi-step commands with increased time       General Comments: Requires assist for how to don/doff LSO. Cues to adhere to precautions with mobility.    Exercises      General Comments General comments (skin integrity, edema, etc.): requires assist for donning/doffing LSO      Pertinent Vitals/Pain Pain Assessment: Faces Faces Pain Scale: Hurts a little bit Pain Location: headache Pain Descriptors / Indicators: Headache Pain Intervention(s): Monitored during session;Premedicated before session    Home Living                      Prior Function  PT Goals (current goals can now be found in the care plan section) Acute Rehab PT Goals Patient Stated Goal: go home PT Goal Formulation: With patient Time For Goal  Achievement: 07/02/15 Potential to Achieve Goals: Good Progress towards PT goals: Progressing toward goals    Frequency  Min 4X/week    PT Plan Discharge plan needs to be updated    Co-evaluation             End of Session Equipment Utilized During Treatment: Back brace Activity Tolerance: Patient tolerated treatment well Patient left: in bed;with call bell/phone within reach;with bed alarm set     Time: 1040-1055 PT Time Calculation (min) (ACUTE ONLY): 15 min  Charges:  $Gait Training: 8-22 mins                    G Codes:      Delorise RoyalsGray, Kimbra Marcelino Brescia  Unnamed Hino B. Torrie Lafavor, PT, DPT Pager #: [phone]  06/26/2015, 11:04 AM

## 2015-06-26 NOTE — Progress Notes (Signed)
Pt has not urinated in 6 hrs. Bladder scan showed urine >200. In and out cath performed. Output was .

## 2015-06-26 NOTE — Discharge Summary (Signed)
Physician Discharge Summary  Patient ID: Melvin DoorJohnny Spindle MRN: [id number] DOB/AGE: March 21, 1931 82 y.o.  Admit date: 06/17/2015 Discharge date: 06/26/2015  Admission Diagnoses:recurrent lumbar stenosis  Discharge Diagnoses:  Active Problems:   Lumbar stenosis with neurogenic claudication   Discharged Condition: incisional pain, no weakness  Hospital Course: surgery  Consults: none  Significant Diagnostic Studies:myelogram  Treatments: surgery.  Discharge Exam: Blood pressure 134/60, pulse 58, temperature 98.8 F (37.1 C), temperature source Oral, resp. rate 20, height 5\' 3"  (1.6 m), weight 68.4 kg (150 lb 12.7 oz), SpO2 98 %. No weakness. Foley in place. bm positive. Mild headache, voided Disposition: declined to go to a [facility]. To go home o be f/u by us and his md     Medication List    ASK your doctor about these medications        amiodarone 200 MG tablet  Commonly known as:  PACERONE  Take 100 mg by mouth daily. Amiodarone to be taken M,W,F only     aspirin EC 81 MG tablet  Take 162 mg by mouth daily.     ELIQUIS 2.5 MG Tabs tablet  Generic drug:  apixaban  Take 2.5 mg by mouth 2 (two) times daily.     lisinopril 10 MG tablet  Commonly known as:  PRINIVIL,ZESTRIL  Take 10 mg by mouth daily before breakfast.     oxyCODONE 5 MG immediate release tablet  Commonly known as:  Oxy IR/ROXICODONE  Take 1 tablet by mouth every 4 (four) hours as needed for severe pain.     PROBIOTIC DAILY Caps  Take 1 capsule by mouth daily.     rosuvastatin 10 MG tablet  Commonly known as:  CRESTOR  Take 10 mg by mouth daily.         Signed: Karn CassisBOTERO,Ottilia Pippenger M 06/26/2015, 9:25 AM

## 2015-06-26 NOTE — Progress Notes (Signed)
Pt is being discharged home. Discharge instructions were given to patient and family. Discharge instructions included but not limited to  Sign and symptoms of infection, when to call the doctor and follow up appoitment were discussed with the patient. 

## 2015-06-26 NOTE — Progress Notes (Signed)
Pt refused to get up from bed to chair this AM. He stated that he wanted to sleep a little more.

## 2015-06-26 NOTE — Progress Notes (Signed)
Old honey comb dressing fell off. New honey comb dressing was applied after cleaning with 2 iodines stick

## 2015-06-26 NOTE — Care Management Note (Signed)
Case Management Note  Patient Details  Name: Rosine DoorJohnny Barua MRN: [id number] Date of Birth: 08/12/1930  Subjective/Objective:                    Action/Plan: CM spoke with patient's wife via phone, per patient's request. Wife would like HH services to be arranged [facility].  CM spoke with Bonita QuinLinda at [facility], who has accepted the referral for discharge home later today.  [facility].  [facility] was notified of need for equipment prior to discharge home.  Patient's son will be coming after work around 2pm to take patient home.  Expected Discharge Date:                  Expected Discharge Plan:  Home w Home Health Services  In-House Referral:     Discharge planning Services  CM Consult  Post Acute Care Choice:  Durable Medical Equipment, Home Health Choice offered to:  Spouse  DME Arranged:  3-N-1, Walker rolling DME Agency:  [facility]  HH Arranged:  PT HH Agency[facility]  [facility]  Status of Service:  Completed, signed off  Medicare Important Message Given:  Yes Date Medicare IM Given:    Medicare IM give by:    Date Additional Medicare IM Given:    Additional Medicare Important Message give by:     If discussed at Long Length of Stay Meetings, dates discussed:    Additional CommentsAnda Kraft:  Salvatore Shear C, RN 06/26/2015, 11:22 AM [phone]

## 2015-09-30 ENCOUNTER — Other Ambulatory Visit: Payer: Self-pay | Admitting: Neurosurgery

## 2015-09-30 DIAGNOSIS — G8929 Other chronic pain: Secondary | ICD-10-CM

## 2015-09-30 DIAGNOSIS — M549 Dorsalgia, unspecified: Principal | ICD-10-CM

## 2015-10-06 ENCOUNTER — Ambulatory Visit
Admission: RE | Admit: 2015-10-06 | Discharge: 2015-10-06 | Disposition: A | Discharge status: Home / Self Care | Payer: Medicare PPO | Source: Ambulatory Visit | Attending: Neurosurgery | Admitting: Neurosurgery

## 2015-10-06 DIAGNOSIS — G8929 Other chronic pain: Secondary | ICD-10-CM

## 2015-10-06 DIAGNOSIS — M549 Dorsalgia, unspecified: Principal | ICD-10-CM

## 2015-10-06 MED ORDER — IOPAMIDOL (ISOVUE-M 200) INJECTION 41%
15.0000 mL | Freq: Once | INTRAMUSCULAR | Status: AC
  Administered 2015-10-06: 15 mL via INTRATHECAL

## 2015-10-06 NOTE — Progress Notes (Signed)
Pt states he has been off Eliquis and Tramadol for the past 2-3 days.

## 2015-10-06 NOTE — Discharge Instructions (Signed)
Myelogram Discharge Instructions  1. Go home and rest quietly for the next 24 hours.  It is important to lie flat for the next 24 hours.  Get up only to go to the restroom.  You may lie in the bed or on a couch on your back, your stomach, your left side or your right side.  You may have one pillow under your head.  You may have pillows between your knees while you are on your side or under your knees while you are on your back.  2. DO NOT drive today.  Recline the seat as far back as it will go, while still wearing your seat belt, on the way home.  3. You may get up to go to the bathroom as needed.  You may sit up for 10 minutes to eat.  You may resume your normal diet and medications unless otherwise indicated.  Drink lots of extra fluids today and tomorrow.  4. The incidence of headache, nausea, or vomiting is about 5% (one in 20 patients).  If you develop a headache, lie flat and drink plenty of fluids until the headache goes away.  Caffeinated beverages may be helpful.  If you develop severe nausea and vomiting or a headache that does not go away with flat bed rest, call [phone].  5. You may resume normal activities after your 24 hours of bed rest is over; however, do not exert yourself strongly or do any heavy lifting tomorrow. If when you get up you have a headache when standing, go back to bed and force fluids for another 24 hours.  6. Call your physician for a follow-up appointment.  The results of your myelogram will be sent directly to your physician by the following day.  7. If you have any questions or if complications develop after you arrive home, please call [phone].  Discharge instructions have been explained to the patient.  The patient, or the person responsible for the patient, fully understands these instructions.       MAY RESUME ELIQUIS TODAY.   May resume Tramadol on October 07, 2015, after 9:30 am.

## 2015-10-08 ENCOUNTER — Other Ambulatory Visit: Payer: Self-pay | Admitting: Neurosurgery

## 2015-10-09 MED ORDER — CEFAZOLIN SODIUM-DEXTROSE 2-4 GM/100ML-% IV SOLN: 2.0000 g | INTRAVENOUS | Status: DC

## 2015-10-09 NOTE — Progress Notes (Signed)
Anesthesia Chart Review: SAME DAY WORK-UP (SDW).  Patient is an 82 year old male scheduled for repair of lumbar pseudomeningocele on 10/10/15 by Dr. Jeral FruitBotero.  He is s/p L4-5, L5-S1 foraminotomy on 06/17/2015 and had cardiology clearance ("moderate risk") for that procedure with permission to hold Eliquis with recommendation to continue ASA.   PMH includes: CAD/MI s/p multiple PCI, HTN, atrial tachycardia, has loop recorder implanted ([employer], 10/2013), PAD (75% left CIA stenosis by 08/16/11 aortogram with BLE runoff/[facility]). Former smoker. BMI 26.  PCP is listed as DR. Damaris SchoonerSusan Dhivianathan. Cardiologist is Dr. Daryel NovemberGary Miller with [facility]. EP Cardiologist (for loop recorder) is Dr. Elberta Leatherwood. Michael Valentine.  Medications include: amiodarone, Eliquis, ASA, lisinopril, Crestor. Nursing staff to contact patient today for SDW call and confirm when he last took Eliquis (on 10/06/15, he reported being off Eliquis for the previous 2-3 days). Preoperative labs reviewed.   EKG 06/09/15: sinus bradycardia (54 bpm).  The following records were received prior to his 05/2015 surgery and can be found scanned under the Media Tab, Correspondence, Encounter 06/17/15:  - 10/18/13 Nuclear stress test:  1. Mild inferior hypoperfusion, resolved post stress. 2. Normal LV contraction. EF 62%. 3. No reversible ischemic perfusion defect. 4. Low risk scan.   - 10/01/13 Echo: 1. Mild LVH. 2. LA and RV are dilated. 3. Mild MR, TR, and PI. 4. Normal LV contraction. EF 60-65%.  - Recent office notes from Dr. Hyacinth MeekerMiller and Dr. Durel SaltsValentine. The 07/10/14 note from EP cardiologist Dr. Collier BullockMichael Valentine says, recent "Holter monitor revealed short episodes of nonsustained ventricular tachycardia in the lower 100 120 beats per minute. There were no prolonged pauses, high-grade AV block or rapid tachyarrhythmias that were symptomatic." He had a loop recorder implanted 11/14/13 for evaluation of syncope and interrogation  revealed SVT with rapid tachycardia and rates up to 160 bpm. He did not have syncope or near syncope with these. Dr. Georgena SpurlingSackett performed EP studies which revealed a variable cycle atrial tachycardia that was given to be very difficult to ablate and performed DCCV with initiation of sotalol therapy. Sotalol was discontinued to due prolonged QT, and he was switched to amiodarone. "He is done very well with amiodarone and has not had any recurrence in his tachycardia."  - 08/16/11 Cardiac cath [facility]):  - LM normal - LAD with proximal 25%, mid 25-50%, and distal 25% stenosis - CX with proximal 25%, mid 25% stenosis - Obtuse marginal had diffuse 25% stenosis - RCA is the dominant vessel with proximal 25% and distal 75% stenosis. TL branch with 25% stenosis. PDA branch had 25% stenosis - PCI and stenting of RCA recommended.  08/16/11: PCI/DES to distal RCA. 75% stenosis reduced to mild irregularity.   Our PAT SDW RN will contact patient today to verify his Eliquis is on hold and ensure no acute changes sine his surgery 4 months ago. If any additional cardiology records since then she will request. Based on currently available information I would anticipate that he could proceed if no acute changes or CV symptoms since he tolerated surgery just a few months ago. However, he is a SDW so further evaluation by his assigned anesthesiologist on the day of surgery.   Velna Ochsllison Davey Limas, PA-C [facility]Anesthesiology Phone [phone] 10/09/2015 12:07 PM

## 2015-10-10 ENCOUNTER — Inpatient Hospital Stay (HOSPITAL_COMMUNITY): Admission: RE | Admit: 2015-10-10 | Payer: Medicare PPO | Source: Ambulatory Visit | Admitting: Neurosurgery

## 2015-10-10 ENCOUNTER — Encounter (HOSPITAL_COMMUNITY): Admission: RE | Payer: Self-pay | Source: Ambulatory Visit

## 2015-10-10 SURGERY — REPAIR OF CEREBROSPINAL FLUID LEAK
Anesthesia: General

## 2015-10-14 ENCOUNTER — Other Ambulatory Visit: Payer: Self-pay | Admitting: Neurosurgery

## 2015-10-16 MED ORDER — CEFAZOLIN SODIUM-DEXTROSE 2-4 GM/100ML-% IV SOLN
2.0000 g | INTRAVENOUS | Status: AC
  Administered 2015-10-17: 2 g via INTRAVENOUS
  Filled 2015-10-16: qty 100

## 2015-10-16 MED ORDER — MUPIROCIN 2 % EX OINT
1.0000 "application " | TOPICAL_OINTMENT | Freq: Once | CUTANEOUS | Status: AC
  Administered 2015-10-17: 1 via TOPICAL
  Filled 2015-10-16: qty 22

## 2015-10-16 NOTE — Progress Notes (Signed)
Called pt for pre-op call this evening and he stated he didn't feel well enough to talk. I told him I would call him back and leave a message on his voicemail with pre-op instructions. He was grateful for that suggestion. Pre-op instructions according to Pre-op call checklist left on pt's voicemail

## 2015-10-17 ENCOUNTER — Inpatient Hospital Stay (HOSPITAL_COMMUNITY): Payer: Medicare PPO | Admitting: Vascular Surgery

## 2015-10-17 ENCOUNTER — Encounter (HOSPITAL_COMMUNITY)
Admission: RE | Disposition: A | Discharge status: Home / Self Care | Payer: Self-pay | Source: Ambulatory Visit | Attending: Neurosurgery

## 2015-10-17 ENCOUNTER — Encounter (HOSPITAL_COMMUNITY): Payer: Self-pay | Admitting: Anesthesiology

## 2015-10-17 ENCOUNTER — Inpatient Hospital Stay (HOSPITAL_COMMUNITY)
Admission: RE | Admit: 2015-10-17 | Discharge: 2015-10-21 | DRG: 029 | Disposition: A | Discharge status: Home / Self Care | Payer: Medicare PPO | Source: Ambulatory Visit | Attending: Neurosurgery | Admitting: Neurosurgery

## 2015-10-17 DIAGNOSIS — Z885 Allergy status to narcotic agent status: Secondary | ICD-10-CM | POA: Diagnosis not present

## 2015-10-17 DIAGNOSIS — G9619 Other disorders of meninges, not elsewhere classified: Secondary | ICD-10-CM | POA: Diagnosis present

## 2015-10-17 DIAGNOSIS — I10 Essential (primary) hypertension: Secondary | ICD-10-CM | POA: Diagnosis present

## 2015-10-17 DIAGNOSIS — Z7982 Long term (current) use of aspirin: Secondary | ICD-10-CM

## 2015-10-17 DIAGNOSIS — G9600 Cerebrospinal fluid leak, unspecified: Secondary | ICD-10-CM | POA: Diagnosis present

## 2015-10-17 DIAGNOSIS — Y838 Other surgical procedures as the cause of abnormal reaction of the patient, or of later complication, without mention of misadventure at the time of the procedure: Secondary | ICD-10-CM | POA: Diagnosis present

## 2015-10-17 DIAGNOSIS — G9782 Other postprocedural complications and disorders of nervous system: Secondary | ICD-10-CM | POA: Diagnosis present

## 2015-10-17 DIAGNOSIS — Z7901 Long term (current) use of anticoagulants: Secondary | ICD-10-CM

## 2015-10-17 DIAGNOSIS — Z79899 Other long term (current) drug therapy: Secondary | ICD-10-CM

## 2015-10-17 DIAGNOSIS — Z87891 Personal history of nicotine dependence: Secondary | ICD-10-CM

## 2015-10-17 DIAGNOSIS — Z981 Arthrodesis status: Secondary | ICD-10-CM

## 2015-10-17 DIAGNOSIS — I252 Old myocardial infarction: Secondary | ICD-10-CM | POA: Diagnosis not present

## 2015-10-17 HISTORY — DX: Unspecified osteoarthritis, unspecified site: M19.90

## 2015-10-17 HISTORY — PX: REPAIR OF CEREBROSPINAL FLUID LEAK: SHX6322

## 2015-10-17 LAB — CBC
HCT: 38.3 % — ABNORMAL LOW (ref 39.0–52.0)
Hemoglobin: 12.9 g/dL — ABNORMAL LOW (ref 13.0–17.0)
MCH: 30.3 pg (ref 26.0–34.0)
MCHC: 33.7 g/dL (ref 30.0–36.0)
MCV: 89.9 fL (ref 78.0–100.0)
PLATELETS: 212 10*3/uL (ref 150–400)
RBC: 4.26 MIL/uL (ref 4.22–5.81)
RDW: 13.5 % (ref 11.5–15.5)
WBC: 7.2 10*3/uL (ref 4.0–10.5)

## 2015-10-17 LAB — BASIC METABOLIC PANEL
Anion gap: 12 (ref 5–15)
BUN: 7 mg/dL (ref 6–20)
CALCIUM: 9.2 mg/dL (ref 8.9–10.3)
CO2: 25 mmol/L (ref 22–32)
CREATININE: 0.77 mg/dL (ref 0.61–1.24)
Chloride: 98 mmol/L — ABNORMAL LOW (ref 101–111)
GFR calc non Af Amer: >60 mL/min (ref >60)
Glucose, Bld: 93 mg/dL (ref 65–99)
Potassium: 4 mmol/L (ref 3.5–5.1)
SODIUM: 135 mmol/L (ref 135–145)

## 2015-10-17 LAB — PROTIME-INR
INR: 1.16 (ref 0.00–1.49)
PROTHROMBIN TIME: 15 s (ref 11.6–15.2)

## 2015-10-17 LAB — SURGICAL PCR SCREEN
MRSA, PCR: NEGATIVE
STAPHYLOCOCCUS AUREUS: NEGATIVE

## 2015-10-17 SURGERY — REPAIR OF CEREBROSPINAL FLUID LEAK
Anesthesia: General | Site: Spine Lumbar

## 2015-10-17 MED ORDER — 0.9 % SODIUM CHLORIDE (POUR BTL) OPTIME
TOPICAL | Status: DC | PRN
  Administered 2015-10-17: 1000 mL

## 2015-10-17 MED ORDER — ONDANSETRON HCL 4 MG/2ML IJ SOLN: 4.0000 mg | Freq: Once | INTRAMUSCULAR | Status: DC | PRN

## 2015-10-17 MED ORDER — SODIUM CHLORIDE 0.9% FLUSH: 3.0000 mL | INTRAVENOUS | Status: DC | PRN

## 2015-10-17 MED ORDER — VANCOMYCIN HCL 1000 MG IV SOLR
INTRAVENOUS | Status: AC
  Filled 2015-10-17: qty 1000

## 2015-10-17 MED ORDER — ARTIFICIAL TEARS OP OINT
TOPICAL_OINTMENT | OPHTHALMIC | Status: DC | PRN
  Administered 2015-10-17: 1 via OPHTHALMIC

## 2015-10-17 MED ORDER — SODIUM CHLORIDE 0.9 % IV SOLN
INTRAVENOUS | Status: DC
  Administered 2015-10-17: 18:00:00 via INTRAVENOUS

## 2015-10-17 MED ORDER — EPHEDRINE SULFATE 50 MG/ML IJ SOLN
INTRAMUSCULAR | Status: DC | PRN
  Administered 2015-10-17: 10 mg via INTRAVENOUS

## 2015-10-17 MED ORDER — ASPIRIN EC 81 MG PO TBEC
162.0000 mg | DELAYED_RELEASE_TABLET | Freq: Every day | ORAL | Status: DC
  Administered 2015-10-17 – 2015-10-21 (×5): 162 mg via ORAL
  Filled 2015-10-17 (×6): qty 2

## 2015-10-17 MED ORDER — LACTATED RINGERS IV SOLN
INTRAVENOUS | Status: DC | PRN
  Administered 2015-10-17 (×2): via INTRAVENOUS

## 2015-10-17 MED ORDER — FENTANYL CITRATE (PF) 100 MCG/2ML IJ SOLN
25.0000 ug | INTRAMUSCULAR | Status: DC | PRN
  Administered 2015-10-17 (×4): 25 ug via INTRAVENOUS

## 2015-10-17 MED ORDER — SODIUM CHLORIDE 0.9% FLUSH
3.0000 mL | Freq: Two times a day (BID) | INTRAVENOUS | Status: DC
  Administered 2015-10-17 – 2015-10-21 (×5): 3 mL via INTRAVENOUS

## 2015-10-17 MED ORDER — ZOLPIDEM TARTRATE 5 MG PO TABS: 5.0000 mg | ORAL_TABLET | Freq: Every evening | ORAL | Status: DC | PRN

## 2015-10-17 MED ORDER — FENTANYL CITRATE (PF) 100 MCG/2ML IJ SOLN
INTRAMUSCULAR | Status: DC | PRN
  Administered 2015-10-17: 150 ug via INTRAVENOUS

## 2015-10-17 MED ORDER — THROMBIN 5000 UNITS EX SOLR
CUTANEOUS | Status: DC | PRN
  Administered 2015-10-17 (×2): 5000 [IU] via TOPICAL

## 2015-10-17 MED ORDER — PHENOL 1.4 % MT LIQD: 1.0000 | OROMUCOSAL | Status: DC | PRN

## 2015-10-17 MED ORDER — TAMSULOSIN HCL 0.4 MG PO CAPS
0.4000 mg | ORAL_CAPSULE | Freq: Every day | ORAL | Status: DC
  Administered 2015-10-17 – 2015-10-20 (×4): 0.4 mg via ORAL
  Filled 2015-10-17 (×4): qty 1

## 2015-10-17 MED ORDER — SUGAMMADEX SODIUM 200 MG/2ML IV SOLN
INTRAVENOUS | Status: AC
  Filled 2015-10-17: qty 2

## 2015-10-17 MED ORDER — ACETAMINOPHEN 10 MG/ML IV SOLN
INTRAVENOUS | Status: AC
  Administered 2015-10-17: 1000 mg
  Filled 2015-10-17: qty 100

## 2015-10-17 MED ORDER — BACITRACIN ZINC 500 UNIT/GM EX OINT
TOPICAL_OINTMENT | CUTANEOUS | Status: DC | PRN
  Administered 2015-10-17: 1 via TOPICAL

## 2015-10-17 MED ORDER — VANCOMYCIN HCL 1000 MG IV SOLR
INTRAVENOUS | Status: DC | PRN
  Administered 2015-10-17: 1000 mg via TOPICAL

## 2015-10-17 MED ORDER — PROMETHAZINE HCL 12.5 MG PO TABS
12.5000 mg | ORAL_TABLET | Freq: Four times a day (QID) | ORAL | Status: DC | PRN
  Filled 2015-10-17: qty 1

## 2015-10-17 MED ORDER — LISINOPRIL 10 MG PO TABS
10.0000 mg | ORAL_TABLET | Freq: Every day | ORAL | Status: DC
  Administered 2015-10-19 – 2015-10-21 (×3): 10 mg via ORAL
  Filled 2015-10-17 (×4): qty 1

## 2015-10-17 MED ORDER — MENTHOL 3 MG MT LOZG: 1.0000 | LOZENGE | OROMUCOSAL | Status: DC | PRN

## 2015-10-17 MED ORDER — LIDOCAINE HCL (CARDIAC) 20 MG/ML IV SOLN
INTRAVENOUS | Status: DC | PRN
  Administered 2015-10-17: 40 mg via INTRAVENOUS

## 2015-10-17 MED ORDER — BUPIVACAINE LIPOSOME 1.3 % IJ SUSP
INTRAMUSCULAR | Status: DC | PRN
  Administered 2015-10-17: 20 mL

## 2015-10-17 MED ORDER — OXYCODONE-ACETAMINOPHEN 5-325 MG PO TABS
1.0000 | ORAL_TABLET | ORAL | Status: DC | PRN
  Administered 2015-10-17: 2 via ORAL
  Administered 2015-10-18: 1 via ORAL
  Administered 2015-10-18: 2 via ORAL
  Administered 2015-10-18: 1 via ORAL
  Administered 2015-10-18: 2 via ORAL
  Administered 2015-10-19 (×3): 1 via ORAL
  Administered 2015-10-20 (×3): 2 via ORAL
  Filled 2015-10-17 (×2): qty 2
  Filled 2015-10-17 (×5): qty 1
  Filled 2015-10-17 (×4): qty 2

## 2015-10-17 MED ORDER — FENTANYL CITRATE (PF) 100 MCG/2ML IJ SOLN
INTRAMUSCULAR | Status: AC
  Administered 2015-10-17: 25 ug via INTRAVENOUS
  Filled 2015-10-17: qty 2

## 2015-10-17 MED ORDER — HEPARIN SODIUM (PORCINE) 5000 UNIT/ML IJ SOLN
5000.0000 [IU] | Freq: Three times a day (TID) | INTRAMUSCULAR | Status: DC
  Administered 2015-10-17 – 2015-10-21 (×10): 5000 [IU] via SUBCUTANEOUS
  Filled 2015-10-17 (×11): qty 1

## 2015-10-17 MED ORDER — HEMOSTATIC AGENTS (NO CHARGE) OPTIME
TOPICAL | Status: DC | PRN
  Administered 2015-10-17: 1 via TOPICAL

## 2015-10-17 MED ORDER — SENNA 8.6 MG PO TABS
1.0000 | ORAL_TABLET | Freq: Two times a day (BID) | ORAL | Status: DC
  Administered 2015-10-17 – 2015-10-21 (×8): 8.6 mg via ORAL
  Filled 2015-10-17 (×8): qty 1

## 2015-10-17 MED ORDER — ACETAMINOPHEN 10 MG/ML IV SOLN: 1000.0000 mg | Freq: Once | INTRAVENOUS | Status: DC

## 2015-10-17 MED ORDER — PROPOFOL 10 MG/ML IV BOLUS
INTRAVENOUS | Status: AC
  Filled 2015-10-17: qty 20

## 2015-10-17 MED ORDER — CEFAZOLIN SODIUM 1-5 GM-% IV SOLN
1.0000 g | Freq: Three times a day (TID) | INTRAVENOUS | Status: AC
  Administered 2015-10-17 – 2015-10-18 (×2): 1 g via INTRAVENOUS
  Filled 2015-10-17 (×2): qty 50

## 2015-10-17 MED ORDER — FENTANYL CITRATE (PF) 250 MCG/5ML IJ SOLN
INTRAMUSCULAR | Status: AC
  Filled 2015-10-17: qty 5

## 2015-10-17 MED ORDER — HYDROMORPHONE HCL 1 MG/ML IJ SOLN: 0.5000 mg | INTRAMUSCULAR | Status: DC | PRN

## 2015-10-17 MED ORDER — ROCURONIUM BROMIDE 100 MG/10ML IV SOLN
INTRAVENOUS | Status: DC | PRN
  Administered 2015-10-17: 40 mg via INTRAVENOUS

## 2015-10-17 MED ORDER — ROSUVASTATIN CALCIUM 10 MG PO TABS
10.0000 mg | ORAL_TABLET | Freq: Every day | ORAL | Status: DC
  Administered 2015-10-18 – 2015-10-21 (×4): 10 mg via ORAL
  Filled 2015-10-17 (×6): qty 1

## 2015-10-17 MED ORDER — DIAZEPAM 5 MG PO TABS: 5.0000 mg | ORAL_TABLET | Freq: Four times a day (QID) | ORAL | Status: DC | PRN

## 2015-10-17 MED ORDER — PROPOFOL 10 MG/ML IV BOLUS
INTRAVENOUS | Status: DC | PRN
  Administered 2015-10-17: 125 mg via INTRAVENOUS

## 2015-10-17 MED ORDER — LACTATED RINGERS IV SOLN
INTRAVENOUS | Status: DC
  Administered 2015-10-17: 07:00:00 via INTRAVENOUS

## 2015-10-17 MED ORDER — ACETAMINOPHEN 650 MG RE SUPP: 650.0000 mg | RECTAL | Status: DC | PRN

## 2015-10-17 MED ORDER — SODIUM CHLORIDE 0.9 % IV SOLN
250.0000 mL | INTRAVENOUS | Status: DC
  Administered 2015-10-17: 250 mL via INTRAVENOUS

## 2015-10-17 MED ORDER — ONDANSETRON HCL 4 MG/2ML IJ SOLN: 4.0000 mg | INTRAMUSCULAR | Status: DC | PRN

## 2015-10-17 MED ORDER — AMIODARONE HCL 100 MG PO TABS
100.0000 mg | ORAL_TABLET | ORAL | Status: DC
  Administered 2015-10-20: 100 mg via ORAL
  Filled 2015-10-17 (×3): qty 1

## 2015-10-17 MED ORDER — BUPIVACAINE LIPOSOME 1.3 % IJ SUSP
20.0000 mL | Freq: Once | INTRAMUSCULAR | Status: DC
  Filled 2015-10-17: qty 20

## 2015-10-17 MED ORDER — SUGAMMADEX SODIUM 200 MG/2ML IV SOLN
INTRAVENOUS | Status: DC | PRN
  Administered 2015-10-17: 126.2 mg via INTRAVENOUS

## 2015-10-17 MED ORDER — ACETAMINOPHEN 325 MG PO TABS
650.0000 mg | ORAL_TABLET | ORAL | Status: DC | PRN
  Filled 2015-10-17: qty 2

## 2015-10-17 SURGICAL SUPPLY — 58 items
Adherus Dural Sealant ×3 IMPLANT
BENZOIN TINCTURE PRP APPL 2/3 (GAUZE/BANDAGES/DRESSINGS) ×3 IMPLANT
BLADE CLIPPER SURG (BLADE) IMPLANT
BUR ACORN 6.0 (BURR) ×2 IMPLANT
BUR ACORN 6.0MM (BURR) ×1
BUR MATCHSTICK NEURO 3.0 LAGG (BURR) ×3 IMPLANT
CANISTER SUCT 3000ML PPV (MISCELLANEOUS) ×3 IMPLANT
CLOSURE WOUND 1/2 X4 (GAUZE/BANDAGES/DRESSINGS) ×1
DRAPE LAPAROTOMY 100X72X124 (DRAPES) ×3 IMPLANT
DRAPE MICROSCOPE LEICA (MISCELLANEOUS) ×3 IMPLANT
DRAPE POUCH INSTRU U-SHP 10X18 (DRAPES) ×3 IMPLANT
DRSG OPSITE POSTOP 4X8 (GAUZE/BANDAGES/DRESSINGS) ×3 IMPLANT
DRSG PAD ABDOMINAL 8X10 ST (GAUZE/BANDAGES/DRESSINGS) IMPLANT
DURAPREP 26ML APPLICATOR (WOUND CARE) ×3 IMPLANT
ELECT REM PT RETURN 9FT ADLT (ELECTROSURGICAL) ×3
ELECTRODE REM PT RTRN 9FT ADLT (ELECTROSURGICAL) ×1 IMPLANT
GAUZE SPONGE 4X4 12PLY STRL (GAUZE/BANDAGES/DRESSINGS) ×3 IMPLANT
GAUZE SPONGE 4X4 16PLY XRAY LF (GAUZE/BANDAGES/DRESSINGS) IMPLANT
GLOVE BIOGEL M 8.0 STRL (GLOVE) ×3 IMPLANT
GLOVE BIOGEL PI IND STRL 7.0 (GLOVE) ×2 IMPLANT
GLOVE BIOGEL PI IND STRL 7.5 (GLOVE) ×2 IMPLANT
GLOVE BIOGEL PI INDICATOR 7.0 (GLOVE) ×4
GLOVE BIOGEL PI INDICATOR 7.5 (GLOVE) ×4
GLOVE ECLIPSE 6.5 STRL STRAW (GLOVE) ×3 IMPLANT
GLOVE ECLIPSE 7.0 STRL STRAW (GLOVE) ×9 IMPLANT
GLOVE EXAM NITRILE LRG STRL (GLOVE) IMPLANT
GLOVE EXAM NITRILE MD LF STRL (GLOVE) IMPLANT
GLOVE EXAM NITRILE XL STR (GLOVE) IMPLANT
GLOVE EXAM NITRILE XS STR PU (GLOVE) IMPLANT
GOWN STRL REUS W/ TWL LRG LVL3 (GOWN DISPOSABLE) ×1 IMPLANT
GOWN STRL REUS W/ TWL XL LVL3 (GOWN DISPOSABLE) IMPLANT
GOWN STRL REUS W/TWL 2XL LVL3 (GOWN DISPOSABLE) IMPLANT
GOWN STRL REUS W/TWL LRG LVL3 (GOWN DISPOSABLE) ×2
GOWN STRL REUS W/TWL XL LVL3 (GOWN DISPOSABLE)
KIT BASIN OR (CUSTOM PROCEDURE TRAY) ×3 IMPLANT
KIT ROOM TURNOVER OR (KITS) ×3 IMPLANT
NEEDLE HYPO 18GX1.5 BLUNT FILL (NEEDLE) IMPLANT
NEEDLE HYPO 21X1.5 SAFETY (NEEDLE) IMPLANT
NEEDLE HYPO 25X1 1.5 SAFETY (NEEDLE) IMPLANT
NEEDLE SPNL 20GX3.5 QUINCKE YW (NEEDLE) IMPLANT
NS IRRIG 1000ML POUR BTL (IV SOLUTION) ×3 IMPLANT
PACK LAMINECTOMY NEURO (CUSTOM PROCEDURE TRAY) ×3 IMPLANT
PAD ARMBOARD 7.5X6 YLW CONV (MISCELLANEOUS) ×9 IMPLANT
PATTIES SURGICAL .5 X1 (DISPOSABLE) ×3 IMPLANT
RUBBERBAND STERILE (MISCELLANEOUS) ×6 IMPLANT
SPONGE LAP 4X18 X RAY DECT (DISPOSABLE) IMPLANT
SPONGE SURGIFOAM ABS GEL SZ50 (HEMOSTASIS) ×3 IMPLANT
STRIP CLOSURE SKIN 1/2X4 (GAUZE/BANDAGES/DRESSINGS) ×2 IMPLANT
SUT NURALON 4 0 TR CR/8 (SUTURE) ×3 IMPLANT
SUT PROLENE 6 0 BV (SUTURE) ×3 IMPLANT
SUT VIC AB 0 CT1 18XCR BRD8 (SUTURE) ×2 IMPLANT
SUT VIC AB 0 CT1 8-18 (SUTURE) ×4
SUT VIC AB 2-0 CP2 18 (SUTURE) ×3 IMPLANT
SUT VIC AB 3-0 SH 8-18 (SUTURE) ×3 IMPLANT
SYR 5ML LL (SYRINGE) IMPLANT
TOWEL OR 17X24 6PK STRL BLUE (TOWEL DISPOSABLE) ×3 IMPLANT
TOWEL OR 17X26 10 PK STRL BLUE (TOWEL DISPOSABLE) ×3 IMPLANT
WATER STERILE IRR 1000ML POUR (IV SOLUTION) ×3 IMPLANT

## 2015-10-17 NOTE — Progress Notes (Signed)
Patient remains laying flat as ordered. Patient and family verbalized understanding of activity restrictions. Denies pain. Pm meds noted to have been given by the previous RN when reviewing record. Foley catheter site wnl and draining yellow urine without difficulty. Patient states that he feels like he has to pee". RN informed patient that he has foley catheter present. callbell in reach. Will continue to monitor.

## 2015-10-17 NOTE — Anesthesia Preprocedure Evaluation (Addendum)
Anesthesia Evaluation  Patient identified by MRN, date of birth, ID band Patient awake    Reviewed: Allergy & Precautions, NPO status , Patient's Chart, lab work & pertinent test results  Airway Mallampati: I  TM Distance: >3 FB Neck ROM: full    Dental  (+) Edentulous Upper, Edentulous Lower, Dental Advidsory Given   Pulmonary former smoker,    breath sounds clear to auscultation       Cardiovascular hypertension, + Past MI  + dysrhythmias  Rhythm:Regular Rate:Normal     Neuro/Psych    GI/Hepatic hiatal hernia,   Endo/Other    Renal/GU      Musculoskeletal   Abdominal   Peds  Hematology   Anesthesia Other Findings   Reproductive/Obstetrics                          Anesthesia Physical Anesthesia Plan  ASA: III  Anesthesia Plan: General   Post-op Pain Management:    Induction: Intravenous  Airway Management Planned: Oral ETT  Additional Equipment:   Intra-op Plan:   Post-operative Plan:   Informed Consent: I have reviewed the patients History and Physical, chart, labs and discussed the procedure including the risks, benefits and alternatives for the proposed anesthesia with the patient or authorized representative who has indicated his/her understanding and acceptance.   Dental Advisory Given  Plan Discussed with: CRNA and Anesthesiologist  Anesthesia Plan Comments:        Anesthesia Quick Evaluation

## 2015-10-17 NOTE — Anesthesia Procedure Notes (Signed)
Procedure Name: Intubation Date/Time: 10/17/2015 9:04 AM Performed by: Carmela RimaMARTINELLI, Zoejane Gaulin F Pre-anesthesia Checklist: Patient being monitored, Suction available, Emergency Drugs available, Patient identified and Timeout performed Patient Re-evaluated:Patient Re-evaluated prior to inductionOxygen Delivery Method: Circle system utilized Preoxygenation: Pre-oxygenation with 100% oxygen Intubation Type: IV induction Ventilation: Mask ventilation without difficulty Laryngoscope Size: Mac and 3 Grade View: Grade I Tube type: Oral Tube size: 7.5 mm Number of attempts: 1 Placement Confirmation: positive ETCO2,  ETT inserted through vocal cords under direct vision and breath sounds checked- equal and bilateral Secured at: 22 cm Tube secured with: Tape Dental Injury: Teeth and Oropharynx as per pre-operative assessment

## 2015-10-17 NOTE — Progress Notes (Signed)
Patient arrived to 5M08 at 1600. Patient alert and oriented X4. Vital signs taken and charted. O2 and IVF. Foley catheter in place. Oriented to room with bed alarm on . Diet ordered. Family at bedside.

## 2015-10-17 NOTE — Care Management Note (Signed)
Case Management Note  Patient Details  Name: Melvin Strickland MRN: [id number] Date of Birth: 1930-09-24  Subjective/Objective:                    Action/Plan: Patient was admitted for a Repair of pseudomeningocele.  Lives at home with spouse. Will follow for discharge needs pending PT/OT evals and physician orders.  Expected Discharge Date:                  Expected Discharge Plan:     In-House Referral:     Discharge planning Services     Post Acute Care Choice:    Choice offered to:     DME Arranged:    DME Agency:     HH Arranged:    HH Agency:     Status of Service:  In process, will continue to follow  Medicare Important Message Given:    Date Medicare IM Given:    Medicare IM give by:    Date Additional Medicare IM Given:    Additional Medicare Important Message give by:     If discussed at Long Length of Stay Meetings, dates discussed:    Additional Comments:  Anda KraftRobarge, Yazeed Pryer C, RN 10/17/2015, 3:49 PM [phone]

## 2015-10-17 NOTE — Transfer of Care (Signed)
Immediate Anesthesia Transfer of Care Note  Patient: Melvin Strickland  Procedure(s) Performed: Procedure(s) with comments: REPAIR OF LUMBAR PSEUDOMENINGOCELE (N/A) - REPAIR OFLUMBAR PSEUDOMENINGOCELE  Patient Location: PACU  Anesthesia Type:General  Level of Consciousness: awake, alert  and oriented  Airway & Oxygen Therapy: Patient Spontanous Breathing and Patient connected to nasal cannula oxygen  Post-op Assessment: Report given to RN, Post -op Vital signs reviewed and stable and Patient moving all extremities X 4  Post vital signs: Reviewed and stable  Last Vitals:  Filed Vitals:   10/17/15 0651  BP: 163/82  Pulse: 54  Temp: 36.8 C  Resp: 16    Last Pain:  Filed Vitals:   10/17/15 0757  PainSc: 4       Patients Stated Pain Goal: 6 (10/17/15 [id number])  Complications: No apparent anesthesia complications

## 2015-10-17 NOTE — H&P (Signed)
Melvin Strickland is an 82 y.o. male.   Chief Complaint:  Lumbar painHPI: patient who underwent lumbar laminectomies for stenosis. Did well but lately he is getting more pain with radiation to both legs no better with conservative treatment. A myelogram showed lumbar pseudomeningocele with stenosis   Past Medical History  Diagnosis Date  . Myocardial infarction ([place])   . Hypertension   . Dysrhythmia   . History of hiatal hernia     told- yes- 30 yrs.ago  . Torn rotator cuff     L side   . Atrial tachycardia ([place])   . Arthritis     very little    Past Surgical History  Procedure Laterality Date  . Back surgery    . Hernia repair Bilateral     inguinal - repair- x2  . Shoulder arthroscopy w/ rotator cuff repair Right   . Colonoscopy    . Cardiac catheterization  1990's & [address] , 2 stents- 2014  . Loop recorder implant  11/14/13  . Lumbar laminectomy/decompression microdiscectomy N/A 06/17/2015    Procedure: lumbar four-five,lumbar five sacral-one Foraminotomy;  Surgeon: Leeroy Cha, MD;  Location: [facility];  Service: Neurosurgery;  Laterality: N/A;    History reviewed. No pertinent family history. Social History:  reports that he quit smoking about 33 years ago. He does not have any smokeless tobacco history on file. He reports that he does not drink alcohol or use illicit drugs.  Allergies:  Allergies  Allergen Reactions  . Morphine And Related Nausea And Vomiting    "made me deathly sick"     Medications Prior to Admission  Medication Sig Dispense Refill  . acetaminophen (TYLENOL) 500 MG tablet Take 500 mg by mouth daily as needed for mild pain.    Marland Kitchen amiodarone (PACERONE) 200 MG tablet Take 100 mg by mouth every Monday, Wednesday, and Friday. Amiodarone to be taken M,W,F only    . HYDROcodone-acetaminophen (NORCO) 10-325 MG tablet Take 1 tablet by mouth every 6 (six) hours as needed for moderate pain.     Marland Kitchen oxyCODONE (OXY IR/ROXICODONE) 5 MG  immediate release tablet Take 5 mg by mouth every 4 (four) hours as needed for severe pain.   0  . promethazine (PHENERGAN) 25 MG tablet Take 12.5 mg by mouth every 6 (six) hours as needed for nausea or vomiting.     . rosuvastatin (CRESTOR) 10 MG tablet Take 10 mg by mouth daily.    . tamsulosin (FLOMAX) 0.4 MG CAPS capsule Take 0.4 mg by mouth daily after supper.     Marland Kitchen apixaban (ELIQUIS) 2.5 MG TABS tablet Take 2.5 mg by mouth 2 (two) times daily.    Marland Kitchen aspirin EC 81 MG tablet Take 162 mg by mouth daily.    Marland Kitchen lisinopril (PRINIVIL,ZESTRIL) 10 MG tablet Take 10 mg by mouth daily before breakfast.     . Probiotic Product (PROBIOTIC DAILY) CAPS Take 1 capsule by mouth daily.      Results for orders placed or performed during the hospital encounter of 10/17/15 (from the past 48 hour(s))  PT- INR Day of Surgery     Status: None   Collection Time: 10/17/15  6:33 AM  Result Value Ref Range   Prothrombin Time 15.0 11.6 - 15.2 seconds   INR 1.16 0.00 - 1.49  CBC     Status: Abnormal   Collection Time: 10/17/15  6:33 AM  Result Value Ref Range   WBC 7.2 4.0 - 10.5  K/uL   RBC 4.26 4.22 - 5.81 MIL/uL   Hemoglobin 12.9 (L) 13.0 - 17.0 g/dL   HCT 38.3 (L) 39.0 - 52.0 %   MCV 89.9 78.0 - 100.0 fL   MCH 30.3 26.0 - 34.0 pg   MCHC 33.7 30.0 - 36.0 g/dL   RDW 13.5 11.5 - 15.5 %   Platelets 212 150 - 400 K/uL  Basic metabolic panel     Status: Abnormal   Collection Time: 10/17/15  6:33 AM  Result Value Ref Range   Sodium 135 135 - 145 mmol/L   Potassium 4.0 3.5 - 5.1 mmol/L   Chloride 98 (L) 101 - 111 mmol/L   CO2 25 22 - 32 mmol/L   Glucose, Bld 93 65 - 99 mg/dL   BUN 7 6 - 20 mg/dL   Creatinine, Ser 0.77 0.61 - 1.24 mg/dL   Calcium 9.2 8.9 - 10.3 mg/dL   GFR calc non Af Amer >60 >60 mL/min   GFR calc Af Amer >60 >60 mL/min    Comment: (NOTE) The eGFR has been calculated using the CKD EPI equation. This calculation has not been validated in all clinical situations. eGFR's persistently <60  mL/min signify possible Chronic Kidney Disease.    Anion gap 12 5 - 15   No results found.  Review of Systems  Constitutional: Negative.   HENT: Negative.   Eyes: Negative.   Respiratory: Negative.   Cardiovascular: Negative.   Gastrointestinal: Negative.   Genitourinary: Negative.   Musculoskeletal: Positive for back pain.  Skin: Negative.   Neurological: Negative.   Endo/Heme/Allergies: Negative.   Psychiatric/Behavioral: Negative.     Blood pressure 163/82, pulse 54, temperature 98.3 F (36.8 C), temperature source Oral, resp. rate 16, height 5' 6"  (1.676 m), weight 63.05 kg (139 lb), SpO2 97 %. Physical Exam hent,nl.neck, nl. Cv, nl. Lungs, clear. Abdomen, nl. Extremities, nl. Neuro,no weakness, sensory, nl. Lumbar area shows a large fluid collection  Assessment/Plan Repair of pseudomeningocele. He is aware of risks and benefits  Floyce Stakes, MD 10/17/2015, 8:40 AM

## 2015-10-17 NOTE — Anesthesia Postprocedure Evaluation (Signed)
Anesthesia Post Note  Patient: Melvin Strickland  Procedure(s) Performed: Procedure(s) (LRB): REPAIR OF LUMBAR PSEUDOMENINGOCELE (N/A)  Patient location during evaluation: PACU Anesthesia Type: General Level of consciousness: awake and awake and alert Pain management: pain level controlled Vital Signs Assessment: post-procedure vital signs reviewed and stable Respiratory status: spontaneous breathing, nonlabored ventilation and respiratory function stable Cardiovascular status: blood pressure returned to baseline Anesthetic complications: no    Last Vitals:  Filed Vitals:   10/17/15 1500 10/17/15 1656  BP: 128/67 123/64  Pulse: 39 56  Temp:  36.9 C  Resp: 36 30    Last Pain:  Filed Vitals:   10/17/15 1657  PainSc: 8                  Kensley Lares COKER

## 2015-10-18 MED ORDER — ENSURE ENLIVE PO LIQD
237.0000 mL | Freq: Two times a day (BID) | ORAL | Status: DC
  Administered 2015-10-18 – 2015-10-21 (×5): 237 mL via ORAL
  Filled 2015-10-18 (×9): qty 237

## 2015-10-18 NOTE — Progress Notes (Signed)
Subjective: Patient reports head and neck painful  Objective: Vital signs in last 24 hours: Temp:  [97 F (36.1 C)-98.4 F (36.9 C)] 98.4 F (36.9 C) (04/29 0511) Pulse Rate:  [39-57] 56 (04/29 0511) Resp:  [10-50] 18 (04/29 0511) BP: (99-162)/(48-82) 99/48 mmHg (04/29 0511) SpO2:  [94 %-100 %] 96 % (04/29 0511)  Intake/Output from previous day: 04/28 0701 - 04/29 0700 In: 1000 [I.V.:1000] Out: 3170 [Urine:3150; Blood:20] Intake/Output this shift:    Physical Exam: Back flat.  Leg strength good.  Lab Results:  Recent Labs  10/17/15 0633  WBC 7.2  HGB 12.9*  HCT 38.3*  PLT 212   BMET  Recent Labs  10/17/15 0633  NA 135  K 4.0  CL 98*  CO2 25  GLUCOSE 93  BUN 7  CREATININE 0.77  CALCIUM 9.2    Studies/Results: No results found.  Assessment/Plan: Patient and wife under impression that he is to lay flat for 24 hours, but per his op note, patient is to lay, head down, for 72 hours.  Will continue with plan, written in the chart, articulated by Dr. Jeral FruitBotero.    LOS: 1 day    Dorian HeckleSTERN,Camree Wigington D, MD 10/18/2015, 7:50 AM

## 2015-10-18 NOTE — Progress Notes (Signed)
Patient awake and laying flat in bed. Patient complained of 8/10 headache. Percocet 1 tablet po prn given. Family to bedside. Patient appears restless in bed. Repositioned for comfort. Will monitor.

## 2015-10-18 NOTE — Op Note (Signed)
NAMNunzio Cory:  Strickland, Melvin               ACCOUNT NO.:  [id number]  MEDICAL RECORD NO.:  [id number]  LOCATION:  [facility]                         FACILITY:  [facility]  PHYSICIAN:  Hilda LiasErnesto Tryton Bodi, M.D.   DATE OF BIRTH:  July 01, 1930  DATE OF PROCEDURE:  10/17/2015 DATE OF DISCHARGE:                              OPERATIVE REPORT   PREOPERATIVE DIAGNOSES:  Pseudomeningocele, postop lumbar laminectomy on December 2016.  POSTOPERATIVE DIAGNOSES:  Pseudomeningocele, postop lumbar laminectomy on December 2016.  PROCEDURE:  Repair of the midline CSF leak.  Microscope.  SURGEON:  Hilda LiasErnesto Cynithia Hakimi, M.D.  ASSISTANT:  Coletta MemosKyle Cabbell, M.D.  CLINICAL HISTORY:  Mr. [name] is a gentleman who about 5 months ago underwent lumbar decompression because of stenosis.  The patient did very well but he had been complaining of back pain mostly associated with some burning sensation in both legs.  The patient has no headache. He has failed conservative treatment.  Myelogram showed that indeed he has a large collection of CSF and surgery was advised.  He and his family knew the risks and benefits.  DESCRIPTION OF PROCEDURE:  The patient was taken to the OR, and after intubation, he was positioned in a prone manner.  The wound was cleaned with DuraPrep and drapes were applied.  Resection of the previous scar was made, and we continued our incision in the midline until immediately in the lower part we found quite a bit of a large amount of CSF.  We brought the microscope into the area.  Indeed there were some midline openings which were taken care with a single stitch of 4-0 Nurolon. Valsalva maneuver was done 3 times up to 40 and was essentially negative.  Then, we used a surgical glue to complete__________  seal off the place.  The area was irrigated.  Valsalva maneuver again was negative. Then, vancomycin powder was left in the operative site, and the wound was closed with different layers of Vicryl and staples.  The  patient is going to remain flat in bed with the head down for at least 72 hours.          ______________________________ Hilda LiasErnesto Azaya Goedde, M.D.     EB/MEDQ  D:  10/17/2015  T:  10/17/2015  Job:  [id number]

## 2015-10-18 NOTE — Progress Notes (Signed)
Initial Nutrition Assessment  DOCUMENTATION CODES:   Non-severe (moderate) malnutrition in context of acute illness/injury  INTERVENTION:  Provide Ensure Enlive po BID, each supplement provides 350 kcal and 20 grams of protein.  RD to continue to monitor.   NUTRITION DIAGNOSIS:   Malnutrition related to acute illness as evidenced by moderate depletions of muscle mass, moderate depletion of body fat.  GOAL:   Patient will meet greater than or equal to 90% of their needs  MONITOR:   PO intake, Supplement acceptance, Weight trends, Labs, I & O's  REASON FOR ASSESSMENT:   Malnutrition Screening Tool    ASSESSMENT:   82 year old Male patient who underwent lumbar laminectomies for stenosis. Did well but lately he is getting more pain with radiation to both legs no better with conservative treatment. A myelogram showed lumbar pseudomeningocele with stenosis   Procedure(4/28): REPAIR OF LUMBAR PSEUDOMENINGOCELE (N/A)  Pt reports having a decreased appetite. No percent meal completion recorded, however pt reports ~50% intake this AM. Pt reports he has been mostly eating 3 meals a day at home PTA. Usual body weight reported to be ~140 lbs. Noted, per Epic weight records, pt with a 7.3% weight loss in 4 months which is not found significant for time frame. RD to order Ensure to aid in caloric and protein needs. Noted orders for pt to lay head down for 72 hours. Pt reports it has been difficult to eat and drink when he is unable to sit up or pick his head up.   Nutrition-Focused physical exam completed. Findings are moderate fat depletion, moderate muscle depletion, and no edema.   Labs and medications reviewed.   Diet Order:  DIET SOFT Room service appropriate?: Yes; Fluid consistency:: Thin  Skin:   (Incision on back)  Last BM:  Unknown  Height:   Ht Readings from Last 1 Encounters:  10/17/15 5\' 6"  (1.676 m)    Weight:   Wt Readings from Last 1 Encounters:  10/17/15 139  lb (63.05 kg)    Ideal Body Weight:  64.5 kg  BMI:  Body mass index is 22.45 kg/(m^2).  Estimated Nutritional Needs:   Kcal:  1700-1900  Protein:  70-80 grams  Fluid:  1.7 - 1.9 L/day  EDUCATION NEEDS:   No education needs identified at this time  Roslyn SmilingStephanie Willamae Demby, MS, RD, LDN Pager # [phone] After hours/ weekend pager # [phone]

## 2015-10-19 NOTE — Progress Notes (Signed)
Patient ID: Melvin Strickland, male   DOB: 16-Jul-1930, 82 y.o.   MRN: [id number] BP 102/64 mmHg  Pulse 70  Temp(Src) 98.2 F (36.8 C) (Oral)  Resp 18  Ht 5\' 6"  (1.676 m)  Wt 63.05 kg (139 lb)  BMI 22.45 kg/m2  SpO2 97% Alert and oriented x 4, speech is clear and fluent Remains flat in bed for another 24 hours Wound is flat and dry.

## 2015-10-20 ENCOUNTER — Encounter (HOSPITAL_COMMUNITY): Payer: Self-pay | Admitting: Neurosurgery

## 2015-10-20 NOTE — Care Management Note (Signed)
Case Management Note  Patient Details  Name: Melvin Strickland MRN: [id number] Date of Birth: 22-Jul-1930  Subjective/Objective:                    Action/Plan: Pt s/p CSF leak. CM continuing to follow for d/c needs.   Expected Discharge Date:                  Expected Discharge Plan:     In-House Referral:     Discharge planning Services     Post Acute Care Choice:    Choice offered to:     DME Arranged:    DME Agency:     HH Arranged:    HH Agency:     Status of Service:  In process, will continue to follow  Medicare Important Message Given:  Yes Date Medicare IM Given:    Medicare IM give by:    Date Additional Medicare IM Given:    Additional Medicare Important Message give by:     If discussed at Long Length of Stay Meetings, dates discussed:    Additional Comments:  Kermit BaloKelli F Teryn Gust, RN 10/20/2015, 3:52 PM

## 2015-10-20 NOTE — Evaluation (Signed)
Physical Therapy Evaluation Patient Details Name: Melvin Strickland MRN: [id number] DOB: 24-Sep-1930 Today's Date: 10/20/2015   History of Present Illness  : patient who underwent lumbar laminectomies for stenosis. Did well but lately he is getting more pain with radiation to both legs no better with conservative treatment. A myelogram showed lumbar pseudomeningocele with stenosis.  CSF leak post op with pt on bedrest 3 days.    Clinical Impression  Pt admitted with above diagnosis. Pt currently with functional limitations due to the deficits listed below (see PT Problem List). Pt was able to ambulate with cane but was unsteady at times.  Pt agreed to use RW on d/c home for safety and wife is there 24 hours.  Will follow acutely.  Pt will benefit from skilled PT to increase their independence and safety with mobility to allow discharge to the venue listed below.     Follow Up Recommendations Home health PT;Supervision/Assistance - 24 hour    Equipment Recommendations   (shower chair)    Recommendations for Other Services       Precautions / Restrictions Precautions Precautions: Back;Fall Precaution Booklet Issued: Yes (comment) Restrictions Weight Bearing Restrictions: No      Mobility  Bed Mobility Overal bed mobility: Needs Assistance Bed Mobility: Rolling;Sidelying to Sit Rolling: Min guard Sidelying to sit: Min guard       General bed mobility comments: cues for log roll.  No physical assist needed but pt took incr time.   Transfers Overall transfer level: Needs assistance Equipment used: Straight cane Transfers: Sit to/from Stand Sit to Stand: Min assist         General transfer comment: Pt stood to EOB with posterior LOB several times while trying to pull up his boxers and pj pants.  Needed min assist for steadying.  Even with cane needed min assist for static stance.   Ambulation/Gait Ambulation/Gait assistance: Min assist Ambulation Distance (Feet): 200  Feet Assistive device: Straight cane Gait Pattern/deviations: Step-through pattern;Decreased stride length;Ataxic;Wide base of support;Trunk flexed;Drifts right/left;Antalgic;Leaning posteriorly   Gait velocity interpretation: Below normal speed for age/gender General Gait Details: Pt ambulated with cane with min assist and cues with overall good cadence but losing balance all directions at times.  Pt states that he has been unsteady for awhile now even with cane. Discussed that pt should use RW initially on d/c home for safety and steady gait and pt has one and agrees.    Stairs            Wheelchair Mobility    Modified Rankin (Stroke Patients Only)       Balance Overall balance assessment: Needs assistance;History of Falls Sitting-balance support: No upper extremity supported;Feet supported Sitting balance-Leahy Scale: Fair     Standing balance support: Single extremity supported;During functional activity Standing balance-Leahy Scale: Poor Standing balance comment: loses balance in all directions in static stance with and without cane.               High level balance activites: Direction changes;Turns;Sudden stops High Level Balance Comments: min assist for challenges to balance             Pertinent Vitals/Pain Pain Assessment: Faces Faces Pain Scale: Hurts little more Pain Location: back incision Pain Descriptors / Indicators: Sore Pain Intervention(s): Limited activity within patient's tolerance;Monitored during session;Repositioned  VSS    Home Living Family/patient expects to be discharged to:: Private residence Living Arrangements: Spouse/significant other;Children;Other relatives Available Help at Discharge: Family;Available 24 hours/day Type of Home: House Home  Access: Stairs to enter Entrance Stairs-Rails: Right;Left;Can reach both Entrance Stairs-Number of Steps: 3 Home Layout: One level Home Equipment: Grab bars - tub/shower;Cane - single  point;Walker - 4 wheels;Bedside commode      Prior Function Level of Independence: Independent with assistive device(s)         Comments: Drives, likes to do yard work     Hand Dominance   Dominant Hand: Right    Extremity/Trunk Assessment   Upper Extremity Assessment: Defer to OT evaluation           Lower Extremity Assessment: Generalized weakness      Cervical / Trunk Assessment: Kyphotic  Communication   Communication: No difficulties  Cognition Arousal/Alertness: Awake/alert Behavior During Therapy: WFL for tasks assessed/performed Overall Cognitive Status: Within Functional Limits for tasks assessed                      General Comments General comments (skin integrity, edema, etc.): Educated pt as to back precautions and he verbalizes understanding.      Exercises        Assessment/Plan    PT Assessment Patient needs continued PT services  PT Diagnosis Generalized weakness   PT Problem List Decreased activity tolerance;Decreased balance;Decreased mobility;Decreased coordination;Decreased knowledge of use of DME;Decreased safety awareness;Decreased knowledge of precautions;Decreased strength  PT Treatment Interventions DME instruction;Gait training;Stair training;Functional mobility training;Therapeutic activities;Therapeutic exercise;Balance training;Patient/family education   PT Goals (Current goals can be found in the Care Plan section) Acute Rehab PT Goals Patient Stated Goal: to go home PT Goal Formulation: With patient Time For Goal Achievement: 10/27/15 Potential to Achieve Goals: Good    Frequency Min 5X/week   Barriers to discharge        Co-evaluation               End of Session Equipment Utilized During Treatment: Gait belt Activity Tolerance: Patient limited by fatigue Patient left: in chair;with call bell/phone within reach Nurse Communication: Mobility status         Time: 1330-1346 PT Time Calculation  (min) (ACUTE ONLY): 16 min   Charges:   PT Evaluation $PT Eval Moderate Complexity: 1 Procedure     PT G CodesBerline Lopes 10-28-2015, 4:20 PM Bandy Honaker,PT Acute Rehabilitation [phone] (pager)

## 2015-10-20 NOTE — Progress Notes (Signed)
Patient ID: Melvin Strickland, male   DOB: Jan 29, 1931, 82 y.o.   MRN: [id number] Stable,no headache. Wound dry. No weakness  Activity  oob

## 2015-10-20 NOTE — Care Management Important Message (Signed)
Important Message  Patient Details  Name: Melvin Strickland MRN: [id number] Date of Birth: 1931/05/31   Medicare Important Message Given:  Yes    Niyam Bisping P Yaritsa Savarino 10/20/2015, 1:58 PM

## 2015-10-21 NOTE — Care Management Note (Signed)
Case Management Note  Patient Details  Name: Melvin Strickland MRN: [id number] Date of Birth: Oct 22, 1930  Subjective/Objective:                    Action/Plan: Patient discharging home with self care. [facility] but Dr Jeral FruitBotero did not order. No further needs per CM.   Expected Discharge Date:                  Expected Discharge Plan:  Home/Self Care  In-House Referral:     Discharge planning Services     Post Acute Care Choice:    Choice offered to:     DME Arranged:    DME Agency:     HH Arranged:    HH Agency:     Status of Service:  Completed, signed off  Medicare Important Message Given:  Yes Date Medicare IM Given:    Medicare IM give by:    Date Additional Medicare IM Given:    Additional Medicare Important Message give by:     If discussed at Long Length of Stay Meetings, dates discussed:    Additional Comments:  Kermit BaloKelli F Berma Harts, RN 10/21/2015, 2:40 PM

## 2015-10-21 NOTE — Progress Notes (Signed)
Pt discharged from hospital per orders from MD. Pt educated on discharge instructions. Pt verbalized understanding of instructions. All questions and concerns were addressed. IV was removed from pt before discharge. Pt exited hospital via wheelchair.

## 2015-10-21 NOTE — Discharge Summary (Signed)
Physician Discharge Summary  Patient ID: Melvin Strickland MRN: [id number] DOB/AGE: 80-Dec-1932 80 y.o.  Admit date: 10/17/2015 Discharge date: 10/21/2015  Admission Diagnoses:lumbar pseudomeningocele  Discharge Diagnoses:  Active Problems:   CSF leak   Discharged Condition: no pain  Hospital Course: surgery  Consults: none  Significant Diagnostic Studies: myelogram  Treatments: repair of pseudomeningocele  Discharge Exam: Blood pressure 123/69, pulse 64, temperature 97.6 F (36.4 C), temperature source Oral, resp. rate 18, height 5\' 6"  (1.676 m), weight 63.05 kg (139 lb), SpO2 94 %. n pain. ambulating  Disposition: home    To see me in 10 days     Medication List    ASK your doctor about these medications        acetaminophen 500 MG tablet  Commonly known as:  TYLENOL  Take 500 mg by mouth daily as needed for mild pain.     amiodarone 200 MG tablet  Commonly known as:  PACERONE  Take 100 mg by mouth every Monday, Wednesday, and Friday. Amiodarone to be taken M,W,F only     aspirin EC 81 MG tablet  Take 162 mg by mouth daily.     ELIQUIS 2.5 MG Tabs tablet  Generic drug:  apixaban  Take 2.5 mg by mouth 2 (two) times daily.     HYDROcodone-acetaminophen 10-325 MG tablet  Commonly known as:  NORCO  Take 1 tablet by mouth every 6 (six) hours as needed for moderate pain.     lisinopril 10 MG tablet  Commonly known as:  PRINIVIL,ZESTRIL  Take 10 mg by mouth daily before breakfast.     oxyCODONE 5 MG immediate release tablet  Commonly known as:  Oxy IR/ROXICODONE  Take 5 mg by mouth every 4 (four) hours as needed for severe pain.     PROBIOTIC DAILY Caps  Take 1 capsule by mouth daily.     promethazine 25 MG tablet  Commonly known as:  PHENERGAN  Take 12.5 mg by mouth every 6 (six) hours as needed for nausea or vomiting.     rosuvastatin 10 MG tablet  Commonly known as:  CRESTOR  Take 10 mg by mouth daily.     tamsulosin 0.4 MG Caps capsule  Commonly  known as:  FLOMAX  Take 0.4 mg by mouth daily after supper.         Signed: Karn CassisBOTERO,Kaizer Dissinger M 10/21/2015, 11:46 AM

## 2016-03-02 ENCOUNTER — Inpatient Hospital Stay (HOSPITAL_COMMUNITY): Payer: Medicare PPO

## 2016-03-02 ENCOUNTER — Inpatient Hospital Stay (HOSPITAL_COMMUNITY)
Admission: AD | Admit: 2016-03-02 | Discharge: 2016-03-04 | DRG: 247 | Disposition: A | Discharge status: Home / Self Care | Payer: Medicare PPO | Source: Other Acute Inpatient Hospital | Attending: Cardiovascular Disease | Admitting: Cardiovascular Disease

## 2016-03-02 DIAGNOSIS — I25119 Atherosclerotic heart disease of native coronary artery with unspecified angina pectoris: Secondary | ICD-10-CM | POA: Diagnosis present

## 2016-03-02 DIAGNOSIS — I10 Essential (primary) hypertension: Secondary | ICD-10-CM | POA: Diagnosis present

## 2016-03-02 DIAGNOSIS — Z79899 Other long term (current) drug therapy: Secondary | ICD-10-CM

## 2016-03-02 DIAGNOSIS — I480 Paroxysmal atrial fibrillation: Secondary | ICD-10-CM | POA: Diagnosis present

## 2016-03-02 DIAGNOSIS — Z981 Arthrodesis status: Secondary | ICD-10-CM

## 2016-03-02 DIAGNOSIS — Z87891 Personal history of nicotine dependence: Secondary | ICD-10-CM

## 2016-03-02 DIAGNOSIS — I252 Old myocardial infarction: Secondary | ICD-10-CM

## 2016-03-02 DIAGNOSIS — Z955 Presence of coronary angioplasty implant and graft: Secondary | ICD-10-CM | POA: Diagnosis not present

## 2016-03-02 DIAGNOSIS — Z23 Encounter for immunization: Secondary | ICD-10-CM | POA: Diagnosis not present

## 2016-03-02 DIAGNOSIS — Z7901 Long term (current) use of anticoagulants: Secondary | ICD-10-CM | POA: Diagnosis not present

## 2016-03-02 DIAGNOSIS — I2510 Atherosclerotic heart disease of native coronary artery without angina pectoris: Secondary | ICD-10-CM | POA: Diagnosis not present

## 2016-03-02 DIAGNOSIS — Z9861 Coronary angioplasty status: Secondary | ICD-10-CM | POA: Diagnosis not present

## 2016-03-02 DIAGNOSIS — Z7982 Long term (current) use of aspirin: Secondary | ICD-10-CM

## 2016-03-02 DIAGNOSIS — Z885 Allergy status to narcotic agent status: Secondary | ICD-10-CM

## 2016-03-02 DIAGNOSIS — R000 Tachycardia, unspecified: Secondary | ICD-10-CM

## 2016-03-02 DIAGNOSIS — I472 Ventricular tachycardia: Secondary | ICD-10-CM | POA: Diagnosis present

## 2016-03-02 DIAGNOSIS — I471 Supraventricular tachycardia: Secondary | ICD-10-CM

## 2016-03-02 DIAGNOSIS — R0602 Shortness of breath: Secondary | ICD-10-CM | POA: Diagnosis not present

## 2016-03-02 LAB — COMPREHENSIVE METABOLIC PANEL
ALBUMIN: 3.6 g/dL (ref 3.5–5.0)
ALT: 11 U/L — ABNORMAL LOW (ref 17–63)
AST: 19 U/L (ref 15–41)
Alkaline Phosphatase: 75 U/L (ref 38–126)
Anion gap: 7 (ref 5–15)
BUN: 12 mg/dL (ref 6–20)
CHLORIDE: 111 mmol/L (ref 101–111)
CO2: 26 mmol/L (ref 22–32)
Calcium: 8.7 mg/dL — ABNORMAL LOW (ref 8.9–10.3)
Creatinine, Ser: 1.04 mg/dL (ref 0.61–1.24)
GFR calc Af Amer: >60 mL/min (ref >60)
GFR calc non Af Amer: >60 mL/min (ref >60)
GLUCOSE: 99 mg/dL (ref 65–99)
POTASSIUM: 4.4 mmol/L (ref 3.5–5.1)
Sodium: 144 mmol/L (ref 135–145)
Total Bilirubin: 0.7 mg/dL (ref 0.3–1.2)
Total Protein: 6.1 g/dL — ABNORMAL LOW (ref 6.5–8.1)

## 2016-03-02 LAB — CBC WITH DIFFERENTIAL/PLATELET
BASOS ABS: 0 10*3/uL (ref 0.0–0.1)
BASOS PCT: 0 %
Eosinophils Absolute: 0.1 10*3/uL (ref 0.0–0.7)
Eosinophils Relative: 1 %
HEMATOCRIT: 39.4 % (ref 39.0–52.0)
HEMOGLOBIN: 12.4 g/dL — AB (ref 13.0–17.0)
Lymphocytes Relative: 23 %
Lymphs Abs: 1.7 10*3/uL (ref 0.7–4.0)
MCH: 29.7 pg (ref 26.0–34.0)
MCHC: 31.5 g/dL (ref 30.0–36.0)
MCV: 94.5 fL (ref 78.0–100.0)
MONO ABS: 0.6 10*3/uL (ref 0.1–1.0)
Monocytes Relative: 8 %
NEUTROS ABS: 5 10*3/uL (ref 1.7–7.7)
NEUTROS PCT: 68 %
Platelets: 190 10*3/uL (ref 150–400)
RBC: 4.17 MIL/uL — ABNORMAL LOW (ref 4.22–5.81)
RDW: 12.8 % (ref 11.5–15.5)
WBC: 7.5 10*3/uL (ref 4.0–10.5)

## 2016-03-02 LAB — TSH: TSH: 0.292 u[IU]/mL — ABNORMAL LOW (ref 0.350–4.500)

## 2016-03-02 LAB — BRAIN NATRIURETIC PEPTIDE: B NATRIURETIC PEPTIDE 5: 86.6 pg/mL (ref 0.0–100.0)

## 2016-03-02 LAB — MRSA PCR SCREENING: MRSA BY PCR: NEGATIVE

## 2016-03-02 LAB — TROPONIN I: Troponin I: 0.03 ng/mL (ref <0.03)

## 2016-03-02 LAB — T4, FREE: FREE T4: 1.01 ng/dL (ref 0.61–1.12)

## 2016-03-02 MED ORDER — RISAQUAD PO CAPS
1.0000 | ORAL_CAPSULE | Freq: Every day | ORAL | Status: DC
  Administered 2016-03-03 – 2016-03-04 (×2): 1 via ORAL
  Filled 2016-03-02 (×3): qty 1

## 2016-03-02 MED ORDER — INFLUENZA VAC SPLIT QUAD 0.5 ML IM SUSY
0.5000 mL | PREFILLED_SYRINGE | INTRAMUSCULAR | Status: AC
  Administered 2016-03-03: 0.5 mL via INTRAMUSCULAR

## 2016-03-02 MED ORDER — PROMETHAZINE HCL 25 MG PO TABS: 12.5000 mg | ORAL_TABLET | Freq: Four times a day (QID) | ORAL | Status: DC | PRN

## 2016-03-02 MED ORDER — OXYCODONE HCL 5 MG PO TABS
5.0000 mg | ORAL_TABLET | ORAL | Status: DC | PRN
  Administered 2016-03-04: 5 mg via ORAL
  Filled 2016-03-02: qty 1

## 2016-03-02 MED ORDER — ROSUVASTATIN CALCIUM 10 MG PO TABS
10.0000 mg | ORAL_TABLET | Freq: Every day | ORAL | Status: DC
  Administered 2016-03-03 – 2016-03-04 (×2): 10 mg via ORAL
  Filled 2016-03-02 (×2): qty 1

## 2016-03-02 MED ORDER — ONDANSETRON HCL 4 MG/2ML IJ SOLN: 4.0000 mg | Freq: Four times a day (QID) | INTRAMUSCULAR | Status: DC | PRN

## 2016-03-02 MED ORDER — AMIODARONE HCL IN DEXTROSE 360-4.14 MG/200ML-% IV SOLN
60.0000 mg/h | INTRAVENOUS | Status: AC
  Administered 2016-03-02: 60 mg/h via INTRAVENOUS

## 2016-03-02 MED ORDER — ACETAMINOPHEN 500 MG PO TABS: 500.0000 mg | ORAL_TABLET | Freq: Every day | ORAL | Status: DC | PRN

## 2016-03-02 MED ORDER — HYDROCODONE-ACETAMINOPHEN 10-325 MG PO TABS
1.0000 | ORAL_TABLET | Freq: Four times a day (QID) | ORAL | Status: DC | PRN
  Administered 2016-03-03 – 2016-03-04 (×4): 1 via ORAL
  Filled 2016-03-02 (×4): qty 1

## 2016-03-02 MED ORDER — ASPIRIN EC 81 MG PO TBEC
162.0000 mg | DELAYED_RELEASE_TABLET | Freq: Every day | ORAL | Status: DC
  Administered 2016-03-03 – 2016-03-04 (×2): 162 mg via ORAL
  Filled 2016-03-02 (×2): qty 2

## 2016-03-02 MED ORDER — AMIODARONE HCL IN DEXTROSE 360-4.14 MG/200ML-% IV SOLN: 30.0000 mg/h | INTRAVENOUS | Status: DC

## 2016-03-02 MED ORDER — ALPRAZOLAM 0.25 MG PO TABS
0.2500 mg | ORAL_TABLET | Freq: Every evening | ORAL | Status: DC | PRN
  Administered 2016-03-02: 0.25 mg via ORAL
  Filled 2016-03-02: qty 1

## 2016-03-02 MED ORDER — AMIODARONE HCL IN DEXTROSE 360-4.14 MG/200ML-% IV SOLN
30.0000 mg/h | INTRAVENOUS | Status: DC
  Administered 2016-03-02 – 2016-03-03 (×2): 30 mg/h via INTRAVENOUS
  Filled 2016-03-02 (×3): qty 200

## 2016-03-02 MED ORDER — ACETAMINOPHEN 325 MG PO TABS
650.0000 mg | ORAL_TABLET | ORAL | Status: DC | PRN
  Administered 2016-03-03 – 2016-03-04 (×2): 650 mg via ORAL
  Filled 2016-03-02 (×2): qty 2

## 2016-03-02 MED ORDER — METOPROLOL TARTRATE 12.5 MG HALF TABLET
12.5000 mg | ORAL_TABLET | Freq: Two times a day (BID) | ORAL | Status: DC
  Administered 2016-03-02: 12.5 mg via ORAL
  Filled 2016-03-02 (×2): qty 1

## 2016-03-02 NOTE — H&P (Signed)
History & Physical    Patient ID: Melvin Strickland MRN: [id number], DOB/AGE: January 12, 1981   Admit date: 03/02/2016   Primary Physician: Dorice LamasHIVIANATHAN, SUSAN S, MD Primary Cardiologist: Daryel NovemberGary Miller  Patient Profile    [facility] complex tachycardia experienced at home.  Past Medical History    Past Medical History:  Diagnosis Date  . Arthritis    very little  . Atrial tachycardia (HCC)   . Dysrhythmia   . History of hiatal hernia    told- yes- 30 yrs.ago  . Hypertension   . Myocardial infarction (HCC)   . Torn rotator cuff    L side     Past Surgical History:  Procedure Laterality Date  . BACK SURGERY    . CARDIAC CATHETERIZATION  1990's & [address]2014   [facility] , 2 stents- 2014  . COLONOSCOPY    . HERNIA REPAIR Bilateral    inguinal - repair- x2  . LOOP RECORDER IMPLANT  11/14/13  . LUMBAR LAMINECTOMY/DECOMPRESSION MICRODISCECTOMY N/A 06/17/2015   Procedure: lumbar four-five,lumbar five sacral-one Foraminotomy;  Surgeon: Hilda LiasErnesto Botero, MD;  Location: [facility];  Service: Neurosurgery;  Laterality: N/A;  . REPAIR OF CEREBROSPINAL FLUID LEAK N/A 10/17/2015   Procedure: REPAIR OF LUMBAR PSEUDOMENINGOCELE;  Surgeon: Hilda LiasErnesto Botero, MD;  Location: [facility] OR[facility];  Service: Neurosurgery;  Laterality: N/A;  REPAIR OFLUMBAR PSEUDOMENINGOCELE  . SHOULDER ARTHROSCOPY W/ ROTATOR CUFF REPAIR Right      Allergies  Allergies  Allergen Reactions  . Morphine And Related Nausea And Vomiting    "made me deathly sick"     History of Present Illness    Melvin Strickland is a patient of Dr. Daryel NovemberGary Miller [facility]. He has a PMH of CAD s/p PCI in the 90's and 2013 (all to RCA as it appears from the records) also with diagnosis of atrial tach? On apixaban (unclear why from my record review). Holter monitor revealed short episodes of nonsustained ventricular tachycardia in the lower 100 120 beats per minute. There  were no prolonged pauses, high-grade AV block or rapid tachyarrhythmias that were symptomatic." He had a loop recorder implanted 11/14/13 for evaluation of syncope and interrogation revealed SVT with rapid tachycardia and rates up to 160 bpm. He did not have syncope or near syncope with these. Dr. Georgena SpurlingSackett performed EP studies which revealed a variable cycle atrial tachycardia that was tiven to be very difficult to ablate and performed DCCV with initiation of sotalol therapy. Sotalol was discontinued to due prolonged QT, and he was switched to amiodarone.  Today he had an episodes of palpitations and presyncope while in bed. Lasted for minutes. His BP was in the 70's systolic. He called EMS. EMS found him to be in a WCT rates of 150-160. [facility] an AMio load. He had NSR on repeat ECG. Pt denies preceding history fo CP, SOB, LEE<PND or orthopnea. No new meds. No missed mwds. No Illicit drug use, smoking or ethanol.   Prior eval: 10/24/13 Nuclear stress test:  1. Mild inferior hypoperfusion, resolved post stress. 2. Normal LV contraction. EF 62%. 3. No reversible ischemic perfusion defect. 4. Low risk scan.   10/01/13 Echo: 1. Mild LVH. 2. LA and RV are dilated. 3. Mild Melvin, TR, and PI. 4. Normal LV contraction. EF 60-65%.  Cardiac cath 08/16/11 [facility]):  - LM normal - LAD with proximal 25%, mid 25-50%, and distal 25% stenosis -  CX with proximal 25%, mid 25% stenosis - Obtuse marginal had diffuse 25% stenosis - RCA is the dominant vessel with proximal 25% and distal 75% stenosis. TL branch with 25% stenosis. PDA branch had 25% stenosis - PCI and stenting of RCA recommended.  08/16/11: PCI/DES to distal RCA. 75% stenosis reduced to mild irregularity.  Home Medications    Prior to Admission medications   Medication Sig Start Date End Date Taking? Authorizing Provider  acetaminophen (TYLENOL) 500 MG tablet Take 500 mg by mouth daily as needed for mild pain.    Historical  Provider, MD  amiodarone (PACERONE) 200 MG tablet Take 100 mg by mouth every Monday, Wednesday, and Friday. Amiodarone to be taken M,W,F only    Historical Provider, MD  apixaban (ELIQUIS) 2.5 MG TABS tablet Take 2.5 mg by mouth 2 (two) times daily.    Historical Provider, MD  aspirin EC 81 MG tablet Take 162 mg by mouth daily.    Historical Provider, MD  HYDROcodone-acetaminophen (NORCO) 10-325 MG tablet Take 1 tablet by mouth every 6 (six) hours as needed for moderate pain.  09/18/15   Historical Provider, MD  lisinopril (PRINIVIL,ZESTRIL) 10 MG tablet Take 10 mg by mouth daily before breakfast.     Historical Provider, MD  oxyCODONE (OXY IR/ROXICODONE) 5 MG immediate release tablet Take 5 mg by mouth every 4 (four) hours as needed for severe pain.  05/01/15   Historical Provider, MD  Probiotic Product (PROBIOTIC DAILY) CAPS Take 1 capsule by mouth daily.    Historical Provider, MD  promethazine (PHENERGAN) 25 MG tablet Take 12.5 mg by mouth every 6 (six) hours as needed for nausea or vomiting.  09/28/15   Historical Provider, MD  rosuvastatin (CRESTOR) 10 MG tablet Take 10 mg by mouth daily.    Historical Provider, MD  tamsulosin (FLOMAX) 0.4 MG CAPS capsule Take 0.4 mg by mouth daily after supper.  09/16/15   Historical Provider, MD    Family History    No family history on file.  Social History    Social History   Social History  . Marital status: Married    Spouse name: N/A  . Number of children: N/A  . Years of education: N/A   Occupational History  . Not on file.   Social History Main Topics  . Smoking status: Former Smoker    Quit date: 06/08/1982  . Smokeless tobacco: Not on file  . Alcohol use No  . Drug use: No  . Sexual activity: Not on file   Other Topics Concern  . Not on file   Social History Narrative  . No narrative on file     Review of Systems    General:  No chills, fever, night sweats or weight changes.  Cardiovascular:  No chest pain, dyspnea on  exertion, edema, orthopnea, palpitations, paroxysmal nocturnal dyspnea. Dermatological: No rash, lesions/masses Respiratory: No cough, dyspnea Urologic: No hematuria, dysuria Abdominal:   No nausea, vomiting, diarrhea, bright red blood per rectum, melena, or hematemesis Neurologic:  No visual changes, wkns, changes in mental status. All other systems reviewed and are otherwise negative except as noted above.  Physical Exam    Blood pressure 139/70, temperature 98.1 F (36.7 C), temperature source Oral, resp. rate 15, height 5\' 6"  (1.676 m), weight 68.6 kg (151 lb 4.8 oz), SpO2 99 %.  General: Pleasant, NAD Psych: Normal affect. Neuro: Alert and oriented X 3. Moves all extremities spontaneously. HEENT: Normal  Neck: Supple without bruits or JVD. Lungs:  Resp regular and unlabored, CTA. Heart: RRR no s3, s4, or murmurs. Abdomen: Soft, non-tender, non-distended, BS + x 4.  Extremities: No clubbing, cyanosis or edema. DP/PT/Radials 2+ and equal bilaterally.  Labs    Troponin (Point of Care Test) No results for input(s): TROPIPOC in the last 72 hours. No results for input(s): CKTOTAL, CKMB, TROPONINI in the last 72 hours. Lab Results  Component Value Date   WBC 7.2 10/17/2015   HGB 12.9 (L) 10/17/2015   HCT 38.3 (L) 10/17/2015   MCV 89.9 10/17/2015   PLT 212 10/17/2015   No results for input(s): NA, K, CL, CO2, BUN, CREATININE, CALCIUM, PROT, BILITOT, ALKPHOS, ALT, AST, GLUCOSE in the last 168 hours.  Invalid input(s): LABALBU No results found for: CHOL, HDL, LDLCALC, TRIG No results found for: [facility]   Radiology Studies    No results found.  ECG & Cardiac Imaging    NSR with inferior Q waves. ECG with WCT: He has ECG with runs of WCT irregular and some that are regular. Appears to me li ke Afib with abberrancy and others look more like VT. Rates up to 160  Assessment & Plan    82 y o man with CAD and history fo NSVT, Atach. On Eliquis what seems to be for an irregular  atach, but never been classified as Afib. Now with WCT which appears to be afib with abberrancy as well as short runs of VT. He has an implanted loop monitor in place. First top is neg, He is asymptomatic now. Potential triggers are ischemia, or a scar mediated even. His Amio home dose was underdosed for VT.   Plan: - NPO for LHC in am. Held Apixaban - Cycle trop - Started metop - Need to interrogate the reveal monitor - held ACEI, can restart if remains HDS - cont ASA and statin - Finish Amio iv load. Consider starting on higher po dose. If uncontrolled with po can consider ablation. However in the past atrial tach was difficult to ablate. Currently not clear which one was the driver of his hypotension with presyncope  The atrial or ventricular tach. HOpefully the reveal monitor interrogation will provide some insight  Signed, Macario Golds, MD 03/02/2016, 8:43 PM

## 2016-03-03 ENCOUNTER — Encounter (HOSPITAL_COMMUNITY)
Admission: AD | Disposition: A | Discharge status: Home / Self Care | Payer: Self-pay | Source: Other Acute Inpatient Hospital | Attending: Cardiovascular Disease

## 2016-03-03 ENCOUNTER — Encounter (HOSPITAL_COMMUNITY): Payer: Self-pay | Admitting: Surgery

## 2016-03-03 DIAGNOSIS — I2510 Atherosclerotic heart disease of native coronary artery without angina pectoris: Secondary | ICD-10-CM

## 2016-03-03 DIAGNOSIS — I472 Ventricular tachycardia: Principal | ICD-10-CM

## 2016-03-03 DIAGNOSIS — Z9861 Coronary angioplasty status: Secondary | ICD-10-CM

## 2016-03-03 HISTORY — PX: CARDIAC CATHETERIZATION: SHX172

## 2016-03-03 LAB — CBC
HEMATOCRIT: 36.4 % — AB (ref 39.0–52.0)
Hemoglobin: 11.4 g/dL — ABNORMAL LOW (ref 13.0–17.0)
MCH: 29.8 pg (ref 26.0–34.0)
MCHC: 31.3 g/dL (ref 30.0–36.0)
MCV: 95 fL (ref 78.0–100.0)
PLATELETS: 167 10*3/uL (ref 150–400)
RBC: 3.83 MIL/uL — ABNORMAL LOW (ref 4.22–5.81)
RDW: 13.1 % (ref 11.5–15.5)
WBC: 5.8 10*3/uL (ref 4.0–10.5)

## 2016-03-03 LAB — POCT ACTIVATED CLOTTING TIME
Activated Clotting Time: 241 seconds
Activated Clotting Time: 290 seconds
Activated Clotting Time: 296 seconds

## 2016-03-03 LAB — BASIC METABOLIC PANEL
Anion gap: 5 (ref 5–15)
BUN: 12 mg/dL (ref 6–20)
CALCIUM: 8.8 mg/dL — AB (ref 8.9–10.3)
CO2: 29 mmol/L (ref 22–32)
Chloride: 112 mmol/L — ABNORMAL HIGH (ref 101–111)
Creatinine, Ser: 1.04 mg/dL (ref 0.61–1.24)
GFR calc Af Amer: >60 mL/min (ref >60)
Glucose, Bld: 108 mg/dL — ABNORMAL HIGH (ref 65–99)
POTASSIUM: 4.5 mmol/L (ref 3.5–5.1)
SODIUM: 146 mmol/L — AB (ref 135–145)

## 2016-03-03 LAB — PROTIME-INR
INR: 1.24
PROTHROMBIN TIME: 15.7 s — AB (ref 11.4–15.2)

## 2016-03-03 LAB — TROPONIN I
TROPONIN I: 0.03 ng/mL — AB (ref <0.03)
Troponin I: 0.03 ng/mL (ref <0.03)

## 2016-03-03 SURGERY — LEFT HEART CATH AND CORONARY ANGIOGRAPHY
Anesthesia: LOCAL

## 2016-03-03 MED ORDER — SODIUM CHLORIDE 0.9 % IV SOLN: INTRAVENOUS | Status: AC

## 2016-03-03 MED ORDER — ADENOSINE (DIAGNOSTIC) 140MCG/KG/MIN
INTRAVENOUS | Status: DC | PRN
  Administered 2016-03-03: 140 ug/kg/min via INTRAVENOUS

## 2016-03-03 MED ORDER — SODIUM CHLORIDE 0.9% FLUSH
3.0000 mL | Freq: Two times a day (BID) | INTRAVENOUS | Status: DC
Start: 2016-03-03 — End: 2016-03-03

## 2016-03-03 MED ORDER — VERAPAMIL HCL 2.5 MG/ML IV SOLN
INTRAVENOUS | Status: AC
  Filled 2016-03-03: qty 2

## 2016-03-03 MED ORDER — FENTANYL CITRATE (PF) 100 MCG/2ML IJ SOLN
INTRAMUSCULAR | Status: DC | PRN
  Administered 2016-03-03 (×3): 25 ug via INTRAVENOUS

## 2016-03-03 MED ORDER — HEPARIN SODIUM (PORCINE) 1000 UNIT/ML IJ SOLN
INTRAMUSCULAR | Status: DC | PRN
  Administered 2016-03-03: 3500 [IU] via INTRAVENOUS
  Administered 2016-03-03: 2500 [IU] via INTRAVENOUS
  Administered 2016-03-03: 3000 [IU] via INTRAVENOUS

## 2016-03-03 MED ORDER — SODIUM CHLORIDE 0.9% FLUSH: 3.0000 mL | INTRAVENOUS | Status: DC | PRN

## 2016-03-03 MED ORDER — LIDOCAINE HCL (PF) 1 % IJ SOLN
INTRAMUSCULAR | Status: DC | PRN
  Administered 2016-03-03: 2 mL via SUBCUTANEOUS

## 2016-03-03 MED ORDER — LIDOCAINE HCL (PF) 1 % IJ SOLN
INTRAMUSCULAR | Status: AC
  Filled 2016-03-03: qty 30

## 2016-03-03 MED ORDER — SODIUM CHLORIDE 0.9% FLUSH
3.0000 mL | Freq: Two times a day (BID) | INTRAVENOUS | Status: DC
  Administered 2016-03-03: 3 mL via INTRAVENOUS

## 2016-03-03 MED ORDER — SODIUM CHLORIDE 0.9 % IV SOLN
INTRAVENOUS | Status: DC | PRN
  Administered 2016-03-03: 10 mL/h via INTRAVENOUS

## 2016-03-03 MED ORDER — CLOPIDOGREL BISULFATE 300 MG PO TABS
ORAL_TABLET | ORAL | Status: AC
  Filled 2016-03-03: qty 1

## 2016-03-03 MED ORDER — ADENOSINE 12 MG/4ML IV SOLN
INTRAVENOUS | Status: AC
  Filled 2016-03-03: qty 12

## 2016-03-03 MED ORDER — GABAPENTIN 300 MG PO CAPS: 300.0000 mg | ORAL_CAPSULE | Freq: Three times a day (TID) | ORAL | Status: DC | PRN

## 2016-03-03 MED ORDER — SODIUM CHLORIDE 0.9 % IV SOLN
INTRAVENOUS | Status: DC
  Administered 2016-03-03: 14:00:00 via INTRAVENOUS

## 2016-03-03 MED ORDER — SODIUM CHLORIDE 0.9 % IV SOLN: 250.0000 mL | INTRAVENOUS | Status: DC | PRN

## 2016-03-03 MED ORDER — ALPRAZOLAM 0.25 MG PO TABS
0.2500 mg | ORAL_TABLET | Freq: Every evening | ORAL | Status: DC | PRN
  Administered 2016-03-03: 0.25 mg via ORAL
  Filled 2016-03-03: qty 1

## 2016-03-03 MED ORDER — AMIODARONE HCL IN DEXTROSE 360-4.14 MG/200ML-% IV SOLN
30.0000 mg/h | INTRAVENOUS | Status: DC
  Administered 2016-03-03 (×2): 30 mg/h via INTRAVENOUS
  Filled 2016-03-03: qty 200

## 2016-03-03 MED ORDER — IOPAMIDOL (ISOVUE-370) INJECTION 76%
INTRAVENOUS | Status: AC
  Filled 2016-03-03: qty 100

## 2016-03-03 MED ORDER — VERAPAMIL HCL 2.5 MG/ML IV SOLN
INTRAVENOUS | Status: DC | PRN
Start: 2016-03-03 — End: 2016-03-03
  Administered 2016-03-03: 10 mL via INTRA_ARTERIAL

## 2016-03-03 MED ORDER — HEPARIN SODIUM (PORCINE) 1000 UNIT/ML IJ SOLN
INTRAMUSCULAR | Status: AC
  Filled 2016-03-03: qty 1

## 2016-03-03 MED ORDER — IOPAMIDOL (ISOVUE-370) INJECTION 76%
INTRAVENOUS | Status: DC | PRN
  Administered 2016-03-03: 170 mL

## 2016-03-03 MED ORDER — MIDAZOLAM HCL 2 MG/2ML IJ SOLN
INTRAMUSCULAR | Status: AC
  Filled 2016-03-03: qty 2

## 2016-03-03 MED ORDER — FENTANYL CITRATE (PF) 100 MCG/2ML IJ SOLN
INTRAMUSCULAR | Status: AC
  Filled 2016-03-03: qty 2

## 2016-03-03 MED ORDER — MIDAZOLAM HCL 2 MG/2ML IJ SOLN
INTRAMUSCULAR | Status: DC | PRN
  Administered 2016-03-03 (×3): 1 mg via INTRAVENOUS

## 2016-03-03 MED ORDER — CLOPIDOGREL BISULFATE 75 MG PO TABS
75.0000 mg | ORAL_TABLET | Freq: Every day | ORAL | Status: DC
  Administered 2016-03-04: 75 mg via ORAL
  Filled 2016-03-03: qty 1

## 2016-03-03 MED ORDER — HEPARIN (PORCINE) IN NACL 2-0.9 UNIT/ML-% IJ SOLN
INTRAMUSCULAR | Status: DC | PRN
  Administered 2016-03-03: 17:00:00

## 2016-03-03 MED ORDER — ADENOSINE 12 MG/4ML IV SOLN
INTRAVENOUS | Status: AC
  Filled 2016-03-03: qty 4

## 2016-03-03 MED ORDER — HEPARIN (PORCINE) IN NACL 2-0.9 UNIT/ML-% IJ SOLN
INTRAMUSCULAR | Status: AC
  Filled 2016-03-03: qty 1000

## 2016-03-03 MED ORDER — CLOPIDOGREL BISULFATE 300 MG PO TABS
ORAL_TABLET | ORAL | Status: DC | PRN
Start: 2016-03-03 — End: 2016-03-03
  Administered 2016-03-03: 600 mg via ORAL

## 2016-03-03 SURGICAL SUPPLY — 19 items
BALLN ANGIOSCULPT RX 2.5X10 (BALLOONS) ×2
BALLN EMERGE MR 2.0X12 (BALLOONS) ×2
BALLN ~~LOC~~ EMERGE MR 2.5X8 (BALLOONS) ×2
BALLOON ANGIOSCULPT RX 2.5X10 (BALLOONS) ×1 IMPLANT
BALLOON EMERGE MR 2.0X12 (BALLOONS) ×1 IMPLANT
BALLOON ~~LOC~~ EMERGE MR 2.5X8 (BALLOONS) ×1 IMPLANT
CATH INFINITI 5FR ANG PIGTAIL (CATHETERS) ×2 IMPLANT
CATH OPTITORQUE TIG 4.0 5F (CATHETERS) ×2 IMPLANT
CATH VISTA GUIDE 6FR XBLAD3.5 (CATHETERS) ×2 IMPLANT
DEVICE RAD COMP TR BAND LRG (VASCULAR PRODUCTS) ×2 IMPLANT
GLIDESHEATH SLEND A-KIT 6F 22G (SHEATH) ×2 IMPLANT
GUIDEWIRE PRESSURE COMET II (WIRE) ×2 IMPLANT
KIT ENCORE 26 ADVANTAGE (KITS) ×2 IMPLANT
KIT HEART LEFT (KITS) ×2 IMPLANT
PACK CARDIAC CATHETERIZATION (CUSTOM PROCEDURE TRAY) ×2 IMPLANT
STENT SYNERGY DES 2.5X16 (Permanent Stent) ×2 IMPLANT
TRANSDUCER W/STOPCOCK (MISCELLANEOUS) ×2 IMPLANT
TUBING CIL FLEX 10 FLL-RA (TUBING) ×2 IMPLANT
WIRE SAFE-T 1.5MM-J .035X260CM (WIRE) ×2 IMPLANT

## 2016-03-03 NOTE — Progress Notes (Signed)
Patient SB with HR 50's. Without c/o dizziness. BP 115/92.  Pt currently on Amiodarone  @ 30 mg/hr and to receive metoprolol 12.5 mg tonight. Dr. Mayford Knifeurner notified and wants to continue amio and hold metoprolol tonight. Will continue to monitor patient.

## 2016-03-03 NOTE — Brief Op Note (Signed)
03/02/2016 - 03/03/2016  4:51 PM  PATIENT:  Melvin Strickland  82 y.o. male   PRE-OPERATIVE DIAGNOSIS:   Atypical Angina Sx Wide Complex Tachycardia CAD-s/p PCI  POST-OPERATIVE DIAGNOSIS:    Focal calcified 70-80% proximal LAD lesion FFR 0.81 -> PCI with DES stent Synergy 2.5 mm x 16 mm (2.75 mm)  Moderate OM1 60% lesion with FFR of 0.94  Patent RCA stents with diffuse mild disease in the RCA.  EF 45-50%, normal EDP  PROCEDURE:  Procedure(s): Left Heart Cath and Coronary Angiography (N/A)   Right radial access with micropuncture Kit - 6 French sheath  Coronary angiography using TIG 4.0 catheter  FFR using 6 [place] XB LAD 3.5 guide - Comet wire.  FFR of LAD with 3 minutes of adenosine a standard rate- start his 0.9, final 0.8 1:00 to be physiologic significant given his white count was tachycardia  PCI: Predilated with a 2.0 mm rectal millimeter balloon followed by a 2.5 mm x 8 mm noncompliant balloon to allow for stent passage.  - Synergy DES 2.5 mm x16 mm post dilated with stent balloon to 2.5 mm  FFR of OM : 2.5 minutes of adenosine. Starting FFR 1.0 finally is 0.94  SURGEON:  Surgeon(s) and Role:    * Leonie Man, MD - Primary  ANESTHESIA:   local and Lidocaine 3 mL; Versed 3 mg, and no 75 g; total heparin 9000 units  EBL:  Total I/O In: 0  Out: 100 [Urine:100]  TR Band: 1640 hrs., 12 mL ar  DICTATION: .Note written in EPIC  PLAN OF CARE: Admit to inpatient   PATIENT DISPOSITION:  PACU - hemodynamically stable.   Delay start of Pharmacological VTE agent (>24hrs) due to surgical blood loss or risk of bleeding: not applicable   Glenetta Hew, M.D., M.S. Interventional Cardiologist   Pager # [phone] Phone # [phone] [address] [place], [place] [address]

## 2016-03-03 NOTE — Consult Note (Signed)
ELECTROPHYSIOLOGY CONSULT NOTE    Patient ID: Melvin Strickland MRN: [id number], DOB/AGE: 80-13-1932 80 y.o.  Admit date: 03/02/2016 Date of Consult: 03/03/2016  Primary Physician: Dorice Lamas, MD Primary Cardiologist: Dr. Daryel November Requesting MD: Dr. Clarnce Flock  Reason for Consultation: WCT  HPI: Melvin Strickland is a 82 y.o. male transferred from [facility] hospital to [facility] yesterday secondary to [facility].  In review of notes, PMHx includes CAD s/p PCI in the 90's and 2013 (all to RCA as it appears from the records) also with diagnosis of atrial tach, PAFib is mentioned by [facility] ER, the patient is uncertain, on low dose Eliquis (oribginally on Xarelto but stopped because of the commercials the patient had seen) the patient he states was started after the loop implant . Holter monitor revealed short episodes of nonsustained ventricular tachycardia in the lower 100 120 beats per minute. There were no prolonged pauses, high-grade AV block or rapid tachyarrhythmias that were symptomatic." He had a loop recorder implanted 11/14/13 by Dr. Durel Salts for evaluation of syncope, record states interrogation revealed SVT with rapid tachycardia and rates up to 160 bpm. He did not have syncope or near syncope with these. Report states Dr. Georgena Spurling performed EP studies which revealed a variable cycle atrial tachycardia that was tiven to be very difficult to ablate and performed DCCV with initiation of sotalol therapy. Sotalol was discontinued to due prolonged QT, and he was switched to amiodarone (100mg  M-W-F only).  record states EMS found him to be in a WCT rates of 150-160. The patient tells me his wife drove him to the hospital.  At [facility] he was started on an Amio load. He had NSR on repeat ECG  The patient tells me he had been feeling unusually tired for about a week, didn't feel like he had that he just didn't feel like he had the energy to do much of anything.  He had a slight discomfort to his lateral  left chest, was random, not positional or exertional and fleeting lasting a second or two only he preceived this as musculoskeletal.  He reports that the day he went to the ER woke feeling weak, checked his BP found it in the 70's, laid back down a while, his wife checking it a little while later again low and brought him to the hospital.  He did not feel any palpitations, no chest complaints of any kind at the time, no near syncope or syncope.  He has not had any SOB, no PND orrthopnea, no edema.  He last saw Dr. Hyacinth Meeker, all was reported well at that time, planned for a visit in a couple months.  [facility] LABS: K+ 4.6 Mag 2.2 BUN/Creat 16/1.20 Trop T <0.01 TSH 0.49 H/H 13/39 WBC 11.6 Plts 198   here: Trop I: 0.03 x3  Device information: Reveal loop recorder implanted 2015 Past Medical History:  Diagnosis Date  . Arthritis    very little  . Atrial tachycardia (HCC)   . Dysrhythmia   . History of hiatal hernia    told- yes- 30 yrs.ago  . Hypertension   . Myocardial infarction (HCC)   . Torn rotator cuff    L side      Surgical History:  Past Surgical History:  Procedure Laterality Date  . BACK SURGERY    . CARDIAC CATHETERIZATION  1990's & [address] , 2 stents- 2014  . COLONOSCOPY    . HERNIA REPAIR Bilateral    inguinal - repair- x2  .  LOOP RECORDER IMPLANT  11/14/13  . LUMBAR LAMINECTOMY/DECOMPRESSION MICRODISCECTOMY N/A 06/17/2015   Procedure: lumbar four-five,lumbar five sacral-one Foraminotomy;  Surgeon: Hilda LiasErnesto Botero, MD;  Location: [facility];  Service: Neurosurgery;  Laterality: N/A;  . REPAIR OF CEREBROSPINAL FLUID LEAK N/A 10/17/2015   Procedure: REPAIR OF LUMBAR PSEUDOMENINGOCELE;  Surgeon: Hilda LiasErnesto Botero, MD;  Location: [facility] NEUR[facility]S;  Service: Neurosurgery;  Laterality: N/A;  REPAIR OFLUMBAR PSEUDOMENINGOCELE  . SHOULDER ARTHROSCOPY W/ ROTATOR CUFF REPAIR Right      Prescriptions Prior to Admission  Medication Sig Dispense Refill Last Dose    . ALPRAZolam (XANAX) 0.25 MG tablet Take 0.25 mg by mouth at bedtime as needed for sleep.    Past Month at Unknown time  . amiodarone (PACERONE) 200 MG tablet Take 100 mg by mouth every Monday, Wednesday, and Friday. Amiodarone to be taken M,W,F only   03/01/2016 at am  . apixaban (ELIQUIS) 2.5 MG TABS tablet Take 2.5 mg by mouth 2 (two) times daily.   03/02/2016 at am  . aspirin EC 81 MG tablet Take 162 mg by mouth daily.   03/02/2016 at am  . gabapentin (NEURONTIN) 300 MG capsule Take 300 mg by mouth 3 (three) times daily as needed (for nerve pain in legs).    Past Month at Unknown time  . lisinopril (PRINIVIL,ZESTRIL) 10 MG tablet Take 10 mg by mouth daily before breakfast.    03/02/2016 at am  . rosuvastatin (CRESTOR) 10 MG tablet Take 10 mg by mouth daily.   03/01/2016 at pm  . tamsulosin (FLOMAX) 0.4 MG CAPS capsule Take 0.4 mg by mouth daily after supper.    03/01/2016 at pm    Inpatient Medications:  . acidophilus  1 capsule Oral Daily  . aspirin EC  162 mg Oral Daily  . metoprolol tartrate  12.5 mg Oral BID  . rosuvastatin  10 mg Oral Daily    Allergies:  Allergies  Allergen Reactions  . Morphine And Related Nausea And Vomiting    "made me deathly sick"     Social History   Social History  . Marital status: Married    Spouse name: N/A  . Number of children: N/A  . Years of education: N/A   Occupational History  . Not on file.   Social History Main Topics  . Smoking status: Former Smoker    Quit date: 06/08/1982  . Smokeless tobacco: Former NeurosurgeonUser  . Alcohol use No  . Drug use: No  . Sexual activity: Not on file   Other Topics Concern  . Not on file   Social History Narrative  . No narrative on file     Family History  Problem Relation Age of Onset  . Heart disease Father   . Heart attack Sister      Review of Systems: All other systems reviewed and are otherwise negative except as noted above.  Physical Exam: Vitals:   03/02/16 2237 03/03/16 0037  03/03/16 0236 03/03/16 0450  BP: 111/60 105/60 116/68 (!) 114/55  Pulse:    (!) 50  Resp: 19 15 14 11   Temp:    97.3 F (36.3 C)  TempSrc:    Oral  SpO2:    99%  Weight:    151 lb 6.4 oz (68.7 kg)  Height:        GEN- The patient is well appearing, alert and oriented x 3 today.   HEENT: normocephalic, atraumatic; sclera clear, conjunctiva pink; hearing intact; oropharynx clear; neck supple, no JVP  Lymph- no cervical lymphadenopathy Lungs- Clear to ausculation bilaterally, normal work of breathing.  No wheezes, rales, rhonchi Heart- Regular rate and rhythm, no murmurs, rubs or gallops, PMI not laterally displaced GI- soft, non-tender, non-distended, bowel sounds present Extremities- no clubbing, cyanosis, or edema; DP/PT/radial pulses 2+ bilaterally MS- no significant deformity or atrophy Skin- warm and dry, no rash or lesion Psych- euthymic mood, full affect Neuro- no gross deficits observed  Labs:   Lab Results  Component Value Date   WBC 5.8 03/03/2016   HGB 11.4 (L) 03/03/2016   HCT 36.4 (L) 03/03/2016   MCV 95.0 03/03/2016   PLT 167 03/03/2016     Recent Labs Lab 03/02/16 2114 03/03/16 0815  NA 144 146*  K 4.4 4.5  CL 111 112*  CO2 26 29  BUN 12 12  CREATININE 1.04 1.04  CALCIUM 8.7* 8.8*  PROT 6.1*  --   BILITOT 0.7  --   ALKPHOS 75  --   ALT 11*  --   AST 19  --   GLUCOSE 99 108*      Radiology/Studies:  Dg Chest Port 1 View Result Date: 03/03/2016 CLINICAL DATA:  Wide complex tachycardia. EXAM: PORTABLE CHEST 1 VIEW COMPARISON:  Radiograph earlier this day at 1726 hours a [facility] FINDINGS: Stable cardiomegaly and mediastinal contours. Implanted monitoring device projects over the left chest wall. No pulmonary edema, focal airspace disease, pleural effusion or pneumothorax. Chronic degenerative change in the shoulders. IMPRESSION: Stable cardiomegaly.  No acute abnormality. Electronically Signed   By: Rubye Oaks M.D.   On: 03/03/2016  02:26    EKG: here is SB, 56bpm,PR , QRS 90ms, QTc [facility]: #1 ST, 117bpm, QRS 88ms #2 SB 53bpm, normal intervals  TELEMETRY: SB high 40's-50's, no WCT, infrequent PVC Telemetry strips from [facility] are SR, there are faster rates also appear to be sinus, one strip with irregularity to it, PVC has different morphology the WCT 170bpm, nonsustained  10/24/13 Nuclear stress test:  1. Mild inferior hypoperfusion, resolved post stress. 2. Normal LV contraction. EF 62%. 3. No reversible ischemic perfusion defect. 4. Low risk scan.   10/01/13 Echo: 1. Mild LVH. 2. LA and RV are dilated. 3. Mild MR, TR, and PI. 4. Normal LV contraction. EF 60-65%.  Cardiac cath 08/16/11 [facility]):  - LM normal - LAD with proximal 25%, mid 25-50%, and distal 25% stenosis - CX with proximal 25%, mid 25% stenosis - Obtuse marginal had diffuse 25% stenosis - RCA is the dominant vessel with proximal 25% and distal 75% stenosis. TL branch with 25% stenosis. PDA branch had 25% stenosis - PCI and stenting of RCA recommended.  08/16/11: PCI/DES to distal RCA. 75% stenosis reduced to mild irregularity.   Assessment and Plan:   1. WCT, VT      On amio gtt, metoprolol (BB held this AM with HR 45)      Interrogate loop      echo      2. CAD      Last PCI 2013       No c/o of any typical sounding CP      No ischemic changes on his EKG      Negative Troponins  3. Unclear hx of possible PAF     Pt denies knowledge of this or any "clot" event ever     Out patient on low dose Eliquis, none since at home yesterday morning that he took, he has not had any since here last  evening., will resume post-cath.  Patient was seen by Dr. Ladona Ridgel, reviewed available [facility] strips, data, recommends cardiac cath to evaluate for any potential ischemic etiology for VT.  The patient has been NPO today, risks/benefits discussed with the patient, he is agreeable and wants to proceed.  Norma Fredrickson,  PA-C 03/03/2016 10:49 AM  EP attending  Patient seen and examined. I have reviewed the findings and agree with those as documented above. The patient has severe weakness, recurrent wide QRS tachycardia, almost certainly due to ventricular tachycardia, with known coronary disease, and questionable history of paroxysmal atrial fibrillation. He was admitted with hypotension, and atypical chest pain. He ruled out for MI. On IV amiodarone, his ventricular arrhythmias have resolved. His exam demonstrates a pleasant 82 year old man, somewhat discount, but in no distress. Cardiovascular exam revealed a regular rate rhythm. The lungs are clear bilaterally. Abdominal was soft and nontender. Extremities demonstrated no edema. Neurologically he was intact.  ECG demonstrates sinus rhythm with runs of ventricular tachycardia.  Labs have been reviewed.   Chest x-ray has been reviewed.  Impression and plan  1. Recurrent ventricular tachycardia, nonsustained, but symptomatic 2. Known coronary artery disease status post prior stenting. 3. Possible paroxysmal atrial fibrillation Discussion - I discussed the treatment options with the patient. My concern is that his ventricular tachycardia represents worsening coronary disease. I recommended that he undergo left heart catheterization. Depending on the results of the heart cath, he will likely need up titration of his amiodarone therapy. Additional recommendations will be based on the findings above.  Lewayne Bunting, M.D.

## 2016-03-03 NOTE — Progress Notes (Signed)
Patients HR sustaining 40-50, lopressor not given. EP notified. Will continue to monitor.

## 2016-03-03 NOTE — H&P (Addendum)
History and Physical Interval Note:  NAME:  Melvin Strickland   MRN: [id number] DOB:  11-21-1930   ADMIT DATE: 03/02/2016   03/03/2016 2:56 PM  Reason for Consultation: WCT  HPI: Melvin Strickland is a 82 y.o. male transferred from [facility] hospital to [facility] yesterday secondary to [facility].  In review of notes, PMHx includes CAD s/p PCI in the 90's and 2013 (all to RCA as it appears from the records) also with diagnosis of atrial tach, PAFib is mentioned by [facility] ER, the patient is uncertain, on low dose Eliquis (oribginally on Xarelto but stopped because of the commercials the patient had seen) the patient he states was started after the loop implant . Holter monitor revealed short episodes of nonsustained ventricular tachycardia in the lower 100 120 beats per minute. There were no prolonged pauses, high-grade AV block or rapid tachyarrhythmias that were symptomatic." He had a loop recorder implanted 11/14/13 by Dr. Durel Salts for evaluation of syncope, record states interrogation revealed SVT with rapid tachycardia and rates up to 160 bpm. He did not have syncope or near syncope with these. Report states Dr. Georgena Spurling performed EP studies which revealed a variable cycle atrial tachycardia that was tiven to be very difficult to ablate and performed DCCV with initiation of sotalol therapy. Sotalol was discontinued to due prolonged QT, and he was switched to amiodarone (100mg  M-W-F only).  record states EMS found him to be in a WCT rates of 150-160. The patient tells me his wife drove him to the hospital.  At [facility] he was started on an Amio load. He had NSR on repeat ECG  The patient tells me he had been feeling unusually tired for about a week, didn't feel like he had that he just didn't feel like he had the energy to do much of anything.  He had a slight discomfort to his lateral left chest, was random, not positional or exertional and fleeting lasting a second or two only he preceived this as musculoskeletal.   He reports that the day he went to the ER woke feeling weak, checked his BP found it in the 70's, laid back down a while, his wife checking it a little while later again low and brought him to the hospital.  He did not feel any palpitations, no chest complaints of any kind at the time, no near syncope or syncope.  He has not had any SOB, no PND orrthopnea, no edema.  He last saw Dr. Hyacinth Meeker, all was reported well at that time, planned for a visit in a couple months.   Past Medical History:  Diagnosis Date  . Arthritis    very little  . Atrial tachycardia (HCC)   . Dysrhythmia   . History of hiatal hernia    told- yes- 30 yrs.ago  . Hypertension   . Myocardial infarction (HCC)   . Torn rotator cuff    L side    Past Surgical History:  Procedure Laterality Date  . BACK SURGERY    . CARDIAC CATHETERIZATION  1990's & [address] , 2 stents- 2014  . COLONOSCOPY    . HERNIA REPAIR Bilateral    inguinal - repair- x2  . LOOP RECORDER IMPLANT  11/14/13  . LUMBAR LAMINECTOMY/DECOMPRESSION MICRODISCECTOMY N/A 06/17/2015   Procedure: lumbar four-five,lumbar five sacral-one Foraminotomy;  Surgeon: Hilda Lias, MD;  Location: [facility];  Service: Neurosurgery;  Laterality: N/A;  . REPAIR OF CEREBROSPINAL FLUID LEAK N/A 10/17/2015   Procedure: REPAIR OF LUMBAR  PSEUDOMENINGOCELE;  Surgeon: Hilda Lias, MD;  Location: [facility];  Service: Neurosurgery;  Laterality: N/A;  REPAIR OFLUMBAR PSEUDOMENINGOCELE  . SHOULDER ARTHROSCOPY W/ ROTATOR CUFF REPAIR Right     FAMHx: Family History  Problem Relation Age of Onset  . Heart disease Father   . Heart attack Sister     SOCHx:  reports that he quit smoking about 33 years ago. He has quit using smokeless tobacco. He reports that he does not drink alcohol or use drugs.  ALLERGIES: Allergies  Allergen Reactions  . Morphine And Related Nausea And Vomiting    "made me deathly sick"     HOME MEDICATIONS: Prescriptions Prior  to Admission  Medication Sig Dispense Refill Last Dose  . ALPRAZolam (XANAX) 0.25 MG tablet Take 0.25 mg by mouth at bedtime as needed for sleep.    Past Month at Unknown time  . amiodarone (PACERONE) 200 MG tablet Take 100 mg by mouth every Monday, Wednesday, and Friday. Amiodarone to be taken M,W,F only   03/01/2016 at am  . apixaban (ELIQUIS) 2.5 MG TABS tablet Take 2.5 mg by mouth 2 (two) times daily.   03/02/2016 at am  . aspirin EC 81 MG tablet Take 162 mg by mouth daily.   03/02/2016 at am  . gabapentin (NEURONTIN) 300 MG capsule Take 300 mg by mouth 3 (three) times daily as needed (for nerve pain in legs).    Past Month at Unknown time  . lisinopril (PRINIVIL,ZESTRIL) 10 MG tablet Take 10 mg by mouth daily before breakfast.    03/02/2016 at am  . rosuvastatin (CRESTOR) 10 MG tablet Take 10 mg by mouth daily.   03/01/2016 at pm  . tamsulosin (FLOMAX) 0.4 MG CAPS capsule Take 0.4 mg by mouth daily after supper.    03/01/2016 at pm   PHYSICAL EXAM: BP (!) 114/55 (BP Location: Left Arm)   Pulse (!) 50   Temp 97.3 F (36.3 C) (Oral)   Resp 11   Ht 5\' 6"  (1.676 m)   Wt 151 lb 6.4 oz (68.7 kg)   SpO2 99%   BMI 24.44 kg/m  GEN- The patient is well appearing, alert and oriented x 3 today.   HEENT: normocephalic, atraumatic; sclera clear, conjunctiva pink; hearing intact; oropharynx clear; neck supple, no JVP Lymph- no cervical lymphadenopathy Lungs- Clear to ausculation bilaterally, normal work of breathing.  No wheezes, rales, rhonchi Heart- Regular rate and rhythm, no murmurs, rubs or gallops, PMI not laterally displaced GI- soft, non-tender, non-distended, bowel sounds present Extremities- no clubbing, cyanosis, or edema; DP/PT/radial pulses 2+ bilaterally MS- no significant deformity or atrophy Skin- warm and dry, no rash or lesion Psych- euthymic mood, full affect Neuro- no gross deficits observed  IMPRESSION & PLAN The patients' history has been reviewed, patient examined, no  change in status from most recent note, stable for surgery. I have reviewed the patients' chart and labs. Questions were answered to the patient's satisfaction.   Assessment and Plan: Per EP  1. WCT, VT      On amio gtt, metoprolol (BB held this AM with HR 45)      Interrogate loop      echo      2. CAD      Last PCI 2013       No c/o of any typical sounding CP      No ischemic changes on his EKG      Negative Troponins  3. Unclear hx of possible PAF  Pt denies knowledge of this or any "clot" event ever     Out patient on low dose Eliquis, none since at home yesterday morning that he took, he has not had any since here last evening., will resume post-cath.  Patient was seen by Dr. Ladona Ridgelaylor, reviewed available [facility] strips, data, recommends cardiac cath to evaluate for any potential ischemic etiology for VT.  The patient has been NPO today, risks/benefits discussed with the patient, he is agreeable and wants to proceed.   Rosine DoorJohnny Ratterman has presented today for Cardiac catheterization, with the diagnosis of wide-complex tachycardia with known CAD The various methods of treatment have been discussed with the patient and family.   Risks / Complications include, but not limited to: Death, MI, CVA/TIA, VF/VT (with defibrillation), Bradycardia (need for temporary pacer placement), contrast induced nephropathy, bleeding / bruising / hematoma / pseudoaneurysm, vascular or coronary injury (with possible emergent CT or Vascular Surgery), adverse medication reactions, infection.     After consideration of risks, benefits and other options for treatment, the patient has consented to Procedure(s):  LEFT HEART CATHETERIZATION AND CORONARY ANGIOGRAPHY +/- AD HOC PERCUTANEOUS CORONARY INTERVENTION   as a surgical intervention.    Cath Lab Visit (complete for each Cath Lab visit)  Clinical Evaluation Leading to the Procedure:   ACS: No.  Non-ACS:    Anginal Classification: No Symptoms -  Wide Complex Tachycardia  Anti-ischemic medical therapy: Maximal Therapy (2 or more classes of medications)  Non-Invasive Test Results: No non-invasive testing performed  Prior CABG: No previous CABG   We will proceed with the planned procedure.   Bryan Lemmaavid Broden Holt [facility] [address] [place], [place]  [id number]  [phone]  03/03/2016 2:56 PM

## 2016-03-04 ENCOUNTER — Inpatient Hospital Stay (HOSPITAL_COMMUNITY): Payer: Medicare PPO

## 2016-03-04 ENCOUNTER — Encounter (HOSPITAL_COMMUNITY): Payer: Self-pay | Admitting: Cardiology

## 2016-03-04 DIAGNOSIS — R0602 Shortness of breath: Secondary | ICD-10-CM

## 2016-03-04 LAB — ECHOCARDIOGRAM COMPLETE
CHL CUP DOP CALC LVOT VTI: 15.8 cm
CHL CUP MV DEC (S): 259
CHL CUP TV REG PEAK VELOCITY: 224 cm/s
EERAT: 6.33
EWDT: 259 ms
FS: 45 % — AB (ref 28–44)
HEIGHTINCHES: 66 in
IV/PV OW: 0.95
LA ID, A-P, ES: 40 mm
LA diam end sys: 40 mm
LA diam index: 2.24 cm/m2
LA vol index: 37.5 mL/m2
LA vol: 67 mL
LAVOLA4C: 68.7 mL
LDCA: 3.46 cm2
LV E/e' medial: 6.33
LV E/e'average: 6.33
LV TDI E'LATERAL: 12.1
LV TDI E'MEDIAL: 6.42
LV dias vol index: 55 mL/m2
LV dias vol: 98 mL (ref 62–150)
LV e' LATERAL: 12.1 cm/s
LVOT SV: 55 mL
LVOT diameter: 21 mm
LVOT peak vel: 74.6 cm/s
LVSYSVOL: 37 mL (ref 21–61)
LVSYSVOLIN: 21 mL/m2
MV Peak grad: 2 mmHg
MVPKAVEL: 35.9 m/s
MVPKEVEL: 76.6 m/s
PV Reg vel dias: 102 cm/s
PW: 9.92 mm — AB (ref 0.6–1.1)
RV LATERAL S' VELOCITY: 8.92 cm/s
RV TAPSE: 24.2 mm
RV sys press: 23 mmHg
Simpson's disk: 63
Stroke v: 62 ml
TR max vel: 224 cm/s
WEIGHTICAEL: 2398.6 [oz_av]

## 2016-03-04 MED ORDER — ASPIRIN EC 81 MG PO TBEC: 81.0000 mg | DELAYED_RELEASE_TABLET | Freq: Every day | ORAL | 3 refills | Status: DC

## 2016-03-04 MED ORDER — CARVEDILOL 3.125 MG PO TABS
3.1250 mg | ORAL_TABLET | Freq: Two times a day (BID) | ORAL | Status: DC
  Administered 2016-03-04: 3.125 mg via ORAL
  Filled 2016-03-04: qty 1

## 2016-03-04 MED ORDER — CLOPIDOGREL BISULFATE 75 MG PO TABS: 75.0000 mg | ORAL_TABLET | Freq: Every day | ORAL | 3 refills | Status: AC

## 2016-03-04 MED ORDER — LISINOPRIL 2.5 MG PO TABS: 2.5000 mg | ORAL_TABLET | Freq: Every day | ORAL | 3 refills | Status: AC

## 2016-03-04 MED ORDER — AMIODARONE HCL 200 MG PO TABS
200.0000 mg | ORAL_TABLET | Freq: Two times a day (BID) | ORAL | Status: DC
Start: 2016-03-04 — End: 2016-03-04
  Administered 2016-03-04: 200 mg via ORAL
  Filled 2016-03-04: qty 1

## 2016-03-04 MED ORDER — METOPROLOL TARTRATE 12.5 MG HALF TABLET: 12.5000 mg | ORAL_TABLET | Freq: Two times a day (BID) | ORAL | Status: DC

## 2016-03-04 MED ORDER — APIXABAN 5 MG PO TABS: 5.0000 mg | ORAL_TABLET | Freq: Two times a day (BID) | ORAL | 3 refills | Status: AC

## 2016-03-04 MED ORDER — AMIODARONE HCL 200 MG PO TABS: 200.0000 mg | ORAL_TABLET | Freq: Every day | ORAL | 3 refills | Status: AC

## 2016-03-04 MED ORDER — CARVEDILOL 3.125 MG PO TABS
3.1250 mg | ORAL_TABLET | Freq: Two times a day (BID) | ORAL | 3 refills | Status: AC
Start: 2016-03-04 — End: ?

## 2016-03-04 MED ORDER — LISINOPRIL 2.5 MG PO TABS
2.5000 mg | ORAL_TABLET | Freq: Every day | ORAL | Status: DC
Start: 2016-03-04 — End: 2016-03-04
  Administered 2016-03-04: 2.5 mg via ORAL
  Filled 2016-03-04: qty 1

## 2016-03-04 NOTE — Discharge Summary (Signed)
DISCHARGE SUMMARY    Patient ID: Melvin Strickland,  MRN: [id number], DOB/AGE: [birth year] 82 y.o.  Admit date: 03/02/2016 Discharge date: 03/04/2016  Primary Care Physician: Dorice Lamas, MD Primary Cardiologist: Dr. Hyacinth Meeker Electrophysiologist: Dr. Ladona Ridgel (new)  Primary Discharge Diagnosis:  1. VT 2. CP 3. CAD     S/p PCI this admission     On ASA, plavix, BB, statin tx 4. PAFib     CHA2DS2Vasc is at least 4, on Eliquis 5. HTN    Allergies  Allergen Reactions  . Morphine And Related Nausea And Vomiting    "made me deathly sick"      Procedures This Admission:  1. 03/03/16: LHC w/PCI, Dr. Herbie Baltimore Conclusion    Prox LAD lesion, 75 %stenosed. Followed by Mid LAD lesion, 40 %stenosed. -- FFR 0.81-0.80 (Significant)  A STENT SYNERGY DES 2.5X16 drug eluting stent was successfully placed. Post intervention, there is a 0% residual stenosis.  1st Mrg lesion, 60 %stenosed. FFR 0.94. Not is partially significant  Mid RCA to Dist RCA stented segment, 5 %stenosed.  There is mild left ventricular systolic dysfunction. The left ventricular ejection fraction is 45-50% by visual estimate.  LV end diastolic pressure is upper limit of normal.   Patent RCA stents, focal lesion in the LAD that was potentially culprit lesion for anginal symptoms and even possibly why, place tachycardia. Borderline FFR, but given the presentation and the location of the lesion, the decision was made to proceed with PCI using DES stent.  The OM1 lesion was not physiologically significant.  Plan:  Transferred to post procedure unit for ongoing care and TR band removal.  Continue amiodarone per EP  Will defer Eliquis decision to EP, but would be okay to continue ELIQUIS with Plavix and aspirin for 1 month then stop aspirin continuing Eliquis plus Plavix.  Continue aggressive cardiac risk factor modification.   03/03/16: TTE Study Conclusions - Left ventricle: The cavity size was  normal. Systolic function was   normal. The estimated ejection fraction was in the range of 50%   to 55%. Hypokinesis of the basal-midinferolateral and inferior   myocardium; in the distribution of the right coronary or left   circumflex coronary artery. Features are consistent with a   pseudonormal left ventricular filling pattern, with concomitant   abnormal relaxation and increased filling pressure (grade 2   diastolic dysfunction). - Left atrium: The atrium was mildly dilated.   Brief HPI: Melvin Strickland is a 82 y.o. male was transferred from [facility] with NSVT episodes for further evaluation and management.   Hospital Course:  The patient was admitted to ICU PMHx includes CAD s/p PCI in the 90's and 2013 (all to RCA as it appears from the records) also with diagnosis of atrial tach, PAFib is mentioned by [facility] ER, the patient is uncertain, on low dose Eliquis (oribginally on Xarelto but stopped because of the commercials the patient had seen) the patient he states was started after the loop implant . Holter monitor revealed short episodes of nonsustained ventricular tachycardia in the lower 100 120 beats per minute. There were no prolonged pauses, high-grade AV block or rapid tachyarrhythmias that were symptomatic." He had a loop recorder implanted 11/14/13 by Dr. Durel Salts for evaluation of syncope, record states interrogation revealed SVT with rapid tachycardia and rates up to 160 bpm. He did not have syncope or near syncope with these. Report states Dr. Georgena Spurling performed EP studies which revealed a variable cycle atrial tachycardia that was  tiven to be very difficult to ablate and performed DCCV with initiation of sotalol therapy. Sotalol was discontinued to due prolonged QT, and he was switched to amiodarone (100mg  M-W-F only).  Record stated EMS found him to be in a WCT rates of 150-160. The patient tells me his wife drove him to the hospital.  At [facility] he was started on an  Amio load. He had NSR on repeat ECG.  The patient   He reported feeling unusually tired for about a week, didn't feel like he had that he just didn't feel like he had the energy to do much of anything.  He had a slight discomfort to his lateral left chest, was random, not positional or exertional and fleeting lasting a second or two only he preceived this as musculoskeletal.  He reports that the day he went to the ER woke feeling weak, checked his BP found it in the 70's, laid back down a while, his wife checking it a little while later again low and brought him to the hospital.  He did not feel any palpitations, no chest complaints of any kind at the time, no near syncope or syncope.  He has not had any SOB, no PND orrthopnea, no edema.  He last saw Dr. Hyacinth Meeker, all was reported well at that time, planned for a visit in a couple months.  He r/o for ACS though given suspect VT underwent LHC given his known hx of CAD, with PCI as noted above.  The patient remained symptom free throughout hs stay, with no recurrent VT.  His echo noted LVEF 50-55% with + WMA, c/w his previous RCA disease/MI.  This morning his amiodarone gtt was transitioned to PO and metoprolol changed to coreg.  HR has maintained 49-50's generally up to 70's with OOB.  Reveal interrogation noted PAFib with RVR as well as wide complex more regular tachycardia as well likely VT.  Cath site is stable, he denies any kind of symptoms and is being discharged with early f/u with Dr. Ladona Ridgel.  He will be discharged on Plavix, ASA and Eliquis, and plan to stop the ASA after 1 month as per Dr. Elissa Hefty recommendation. The patient was seen and examined by Dr. Ladona Ridgel, felt stable for discharge to home.  Physical Exam: Vitals:   03/04/16 0700 03/04/16 0818 03/04/16 1124 03/04/16 1200  BP:  (!) 151/61 125/74 (!) 159/62  Pulse:  (!) 48 (!) 51   Resp:  14 17 13   Temp:  97.8 F (36.6 C) 97.8 F (36.6 C)   TempSrc:  Oral Oral   SpO2:  95% 96%   Weight:  149 lb 14.6 oz (68 kg)     Height:        Labs:   Lab Results  Component Value Date   WBC 5.8 03/03/2016   HGB 11.4 (L) 03/03/2016   HCT 36.4 (L) 03/03/2016   MCV 95.0 03/03/2016   PLT 167 03/03/2016     Recent Labs Lab 03/02/16 2114 03/03/16 0815  NA 144 146*  K 4.4 4.5  CL 111 112*  CO2 26 29  BUN 12 12  CREATININE 1.04 1.04  CALCIUM 8.7* 8.8*  PROT 6.1*  --   BILITOT 0.7  --   ALKPHOS 75  --   ALT 11*  --   AST 19  --   GLUCOSE 99 108*    Discharge Medications:    Medication List    TAKE these medications   ALPRAZolam 0.25 MG tablet  Commonly known as:  XANAX Take 0.25 mg by mouth at bedtime as needed for sleep.   amiodarone 200 MG tablet Commonly known as:  PACERONE Take 1 tablet (200 mg total) by mouth daily. What changed:  how much to take  when to take this  additional instructions   apixaban 5 MG Tabs tablet Commonly known as:  ELIQUIS Take 1 tablet (5 mg total) by mouth 2 (two) times daily. What changed:  medication strength  how much to take   aspirin EC 81 MG tablet Take 1 tablet (81 mg total) by mouth daily. What changed:  how much to take   carvedilol 3.125 MG tablet Commonly known as:  COREG Take 1 tablet (3.125 mg total) by mouth 2 (two) times daily with a meal.   clopidogrel 75 MG tablet Commonly known as:  PLAVIX Take 1 tablet (75 mg total) by mouth daily with breakfast. Start taking on:  03/05/2016   gabapentin 300 MG capsule Commonly known as:  NEURONTIN Take 300 mg by mouth 3 (three) times daily as needed (for nerve pain in legs).   lisinopril 2.5 MG tablet Commonly known as:  PRINIVIL,ZESTRIL Take 1 tablet (2.5 mg total) by mouth daily. Start taking on:  03/05/2016 What changed:  medication strength  how much to take  when to take this   rosuvastatin 10 MG tablet Commonly known as:  CRESTOR Take 10 mg by mouth daily.   tamsulosin 0.4 MG Caps capsule Commonly known as:  FLOMAX Take 0.4 mg by mouth daily  after supper.       Disposition:  Discharge Instructions    Diet - low sodium heart healthy    Complete by:  As directed    Increase activity slowly    Complete by:  As directed      Follow-up Information    Lewayne BuntingGregg Taylor, MD Follow up on 03/18/2016.   Specialty:  Cardiology Why:  10:15AM Contact information: [address] [place] [id number] [phone]           Duration of Discharge Encounter: Greater than 30 minutes including physician time.  Norma FredricksonSigned, Renee Ursuy, PA-C 03/04/2016 4:19 PM  EP Attending  Patient seen and examined. Agree with above. The patient appears to be stable for discharge home. He will continue ASA, Plavix, and Eliquis for a month then triple therapy to double therapy. I will see him back in 4-6 weeks.  Lewayne BuntingGregg Taylor, M.D.

## 2016-03-04 NOTE — Progress Notes (Signed)
Echocardiogram 2D Echocardiogram has been performed.  Melvin Strickland 03/04/2016, 9:43 AM

## 2016-03-04 NOTE — Discharge Instructions (Signed)
No lifting over 5 lbs for 1 week. No vigorous or sexual activity for 1 week.  Keep procedure site clean & dry. If you notice increased pain, swelling, bleeding or pus, call/return!  You may shower, but no soaking baths/hot tubs/pools for 1 week.

## 2016-03-04 NOTE — Progress Notes (Signed)
D/c instructions gone over with pt and family.  Pt able to teach back new medications and instructions for the care of the radial site.  Pt d/c to car in w/c.    Aretta NipMoore, Ramiel Forti C, RN

## 2016-03-04 NOTE — Progress Notes (Signed)
SUBJECTIVE: The patient is doing well today.  At this time, he denies chest pain, shortness of breath, or any new concerns.  Would like to go home.  Marland Kitchen acidophilus  1 capsule Oral Daily  . amiodarone  200 mg Oral BID  . aspirin EC  162 mg Oral Daily  . clopidogrel  75 mg Oral Q breakfast  . metoprolol tartrate  12.5 mg Oral BID  . rosuvastatin  10 mg Oral Daily  . sodium chloride flush  3 mL Intravenous Q12H      OBJECTIVE: Physical Exam: Vitals:   03/03/16 2300 03/04/16 0000 03/04/16 0100 03/04/16 0400  BP: (!) 147/69 133/89 (!) 116/43 (!) 109/48  Pulse: (!) 51 61 (!) 56 (!) 49  Resp: 16 19 16 16   Temp:  98.7 F (37.1 C)    TempSrc:  Oral    SpO2: 97% 96% 95% 95%  Weight:      Height:        Intake/Output Summary (Last 24 hours) at 03/04/16 0800 Last data filed at 03/04/16 0700  Gross per 24 hour  Intake           887.51 ml  Output              300 ml  Net           587.51 ml    Telemetry reveals SB 40's-50's, 60bpm currently sitting up in bed talking, no VT  GEN- The patient is well appearing, alert and oriented x 3 today.   Head- normocephalic, atraumatic Eyes-  Sclera clear, conjunctiva pink Ears- hearing intact Oropharynx- clear Neck- supple, no JVP Lungs- Clear to ausculation bilaterally, normal work of breathing Heart- RRR, no significant murmurs, no rubs or gallops GI- soft, NT, ND Extremities- no clubbing, cyanosis, or edema, R wrist, no hematoma, no bleeding, bounding radial pulse,Skin- no rash or lesion Psych- euthymic mood, full affect Neuro- no gross deficits appreciated  LABS: Basic Metabolic Panel:  Recent Labs  60/45/40 2114 03/03/16 0815  NA 144 146*  K 4.4 4.5  CL 111 112*  CO2 26 29  GLUCOSE 99 108*  BUN 12 12  CREATININE 1.04 1.04  CALCIUM 8.7* 8.8*   Liver Function Tests:  Recent Labs  03/02/16 2114  AST 19  ALT 11*  ALKPHOS 75  BILITOT 0.7  PROT 6.1*  ALBUMIN 3.6   CBC:  Recent Labs  03/02/16 2114  03/03/16 0815  WBC 7.5 5.8  NEUTROABS 5.0  --   HGB 12.4* 11.4*  HCT 39.4 36.4*  MCV 94.5 95.0  PLT 190 167   Cardiac Enzymes:  Recent Labs  03/02/16 2114 03/03/16 0234 03/03/16 0815  TROPONINI 0.03* 0.03* 0.03*   Thyroid Function Tests:  Recent Labs  03/02/16 2114  TSH 0.292*    03/03/16: LHC Conclusion    Prox LAD lesion, 75 %stenosed. Followed by Mid LAD lesion, 40 %stenosed. -- FFR 0.81-0.80 (Significant)  A STENT SYNERGY DES 2.5X16 drug eluting stent was successfully placed. Post intervention, there is a 0% residual stenosis.  1st Mrg lesion, 60 %stenosed. FFR 0.94. Not is partially significant  Mid RCA to Dist RCA stented segment, 5 %stenosed.  There is mild left ventricular systolic dysfunction. The left ventricular ejection fraction is 45-50% by visual estimate.  LV end diastolic pressure is upper limit of normal.   Patent RCA stents, focal lesion in the LAD that was potentially culprit lesion for anginal symptoms and even possibly why, place tachycardia. Borderline FFR,  but given the presentation and the location of the lesion, the decision was made to proceed with PCI using DES stent.  The OM1 lesion was not physiologically significant.  Plan:  Transferred to post procedure unit for ongoing care and TR band removal.  Continue amiodarone per EP  Will defer Eliquis decision to EP, but would be okay to continue ELIQUIS with Plavix and aspirin for 1 month then stop aspirin continuing Eliquis plus Plavix.  Continue aggressive cardiac risk factor modification.      ASSESSMENT AND PLAN:   1.  NSVT      On amio gtt, metoprolol (BB has been held with HR 40's)      Loop interrogation is pending      echo is pending      EF by LV gram 45-50%  Will transition to PO amiodarone this morning, most likely will monitor one more day      2. CAD      s/p LHC/PCI yesterday, appreciate Dr. Elissa HeftyHarding's help      No c/o of any typical sounding CP      No  ischemic changes on his EKG      Negative Troponins  3. Unclear hx of possible PAF     Pt denies knowledge of this or any "clot" event ever was on low dose Eliquis out patient, therapeutic dose would be 5mg  given his creat/eweight     Will await loop findings, prior to a/c decisions given ASA/Plavix   Francis DowseRenee Ursuy, PA-C 03/04/2016 8:00 AM  EP Attending  Patient seen and examined. Agree with above. The patient is doing well after PCI. He has had no more arrhythmias and would like to go home. From my perspective, DC is ok. He should go home on amio 200 mg daily. I would like to see him back in  in 4-6 weeks.Would switch to coreg 3.125 mg twice daily.  Consider low dose lisinopril 2.5 mg daily at DC as well.  Leonia ReevesGregg Chandra Feger,M.D.

## 2016-03-18 ENCOUNTER — Ambulatory Visit (INDEPENDENT_AMBULATORY_CARE_PROVIDER_SITE_OTHER): Payer: Medicare PPO | Admitting: Internal Medicine

## 2016-03-18 ENCOUNTER — Encounter: Payer: Self-pay | Admitting: Internal Medicine

## 2016-03-18 VITALS — BP 130/62 | HR 59 | Ht 66.0 in | Wt 150.0 lb

## 2016-03-18 DIAGNOSIS — I472 Ventricular tachycardia, unspecified: Secondary | ICD-10-CM

## 2016-03-18 DIAGNOSIS — I998 Other disorder of circulatory system: Secondary | ICD-10-CM

## 2016-03-18 DIAGNOSIS — I480 Paroxysmal atrial fibrillation: Secondary | ICD-10-CM | POA: Diagnosis not present

## 2016-03-18 MED ORDER — GABAPENTIN 300 MG PO CAPS: 300.0000 mg | ORAL_CAPSULE | Freq: Three times a day (TID) | ORAL | 12 refills | Status: AC | PRN

## 2016-03-18 NOTE — Progress Notes (Signed)
HPI Mr. [name] returns today for followup. He is a pleasant 82 yo man with CAD, s/p recent stenting, PAF, atrial tachycardia and VT in the setting of preserved LV function. He c/o easy bruisibility on the combination of ASA, Plavix and Eliquis. He denies chest pain or sob. No syncope. No palpitations. Allergies  Allergen Reactions  . Morphine And Related Nausea And Vomiting    "made me deathly sick"      Current Outpatient Prescriptions  Medication Sig Dispense Refill  . ALPRAZolam (XANAX) 0.25 MG tablet Take 0.25 mg by mouth at bedtime as needed for sleep.     Marland Kitchen. amiodarone (PACERONE) 200 MG tablet Take 1 tablet (200 mg total) by mouth daily. 30 tablet 3  . apixaban (ELIQUIS) 5 MG TABS tablet Take 1 tablet (5 mg total) by mouth 2 (two) times daily. 60 tablet 3  . aspirin EC 81 MG tablet Take 1 tablet (81 mg total) by mouth daily. 30 tablet 3  . carvedilol (COREG) 3.125 MG tablet Take 1 tablet (3.125 mg total) by mouth 2 (two) times daily with a meal. 60 tablet 3  . clopidogrel (PLAVIX) 75 MG tablet Take 1 tablet (75 mg total) by mouth daily with breakfast. 30 tablet 3  . gabapentin (NEURONTIN) 300 MG capsule Take 300 mg by mouth 3 (three) times daily as needed (for nerve pain in legs).     Marland Kitchen. lisinopril (PRINIVIL,ZESTRIL) 2.5 MG tablet Take 1 tablet (2.5 mg total) by mouth daily. 30 tablet 3  . rosuvastatin (CRESTOR) 10 MG tablet Take 10 mg by mouth daily.    . tamsulosin (FLOMAX) 0.4 MG CAPS capsule Take 0.4 mg by mouth daily after supper.      No current facility-administered medications for this visit.      Past Medical History:  Diagnosis Date  . Arthritis    very little  . Atrial tachycardia (HCC)   . Dysrhythmia   . History of hiatal hernia    told- yes- 30 yrs.ago  . Hypertension   . Myocardial infarction (HCC)   . Torn rotator cuff    L side     ROS:   All systems reviewed and negative except as noted in the HPI.   Past Surgical History:  Procedure  Laterality Date  . BACK SURGERY    . CARDIAC CATHETERIZATION  1990's & [address]   [facility] , 2 stents- 2014  . CARDIAC CATHETERIZATION N/A 03/03/2016   Procedure: Left Heart Cath and Coronary Angiography;  Surgeon: Marykay Lexavid W Harding, MD;  [facility];  Service: Cardiovascular;  Laterality: N/A;  . COLONOSCOPY    . HERNIA REPAIR Bilateral    inguinal - repair- x2  . LOOP RECORDER IMPLANT  11/14/13  . LUMBAR LAMINECTOMY/DECOMPRESSION MICRODISCECTOMY N/A 06/17/2015   Procedure: lumbar four-five,lumbar five sacral-one Foraminotomy;  Surgeon: Hilda LiasErnesto Botero, MD;  Location: [facility] NEURO ORS;  Service: Neurosurgery;  Laterality: N/A;  . REPAIR OF CEREBROSPINAL FLUID LEAK N/A 10/17/2015   Procedure: REPAIR OF LUMBAR PSEUDOMENINGOCELE;  Surgeon: Hilda LiasErnesto Botero, MD;  Location: [facility] NEURO ORS;  Service: Neurosurgery;  Laterality: N/A;  REPAIR OFLUMBAR PSEUDOMENINGOCELE  . SHOULDER ARTHROSCOPY W/ ROTATOR CUFF REPAIR Right      Family History  Problem Relation Age of Onset  . Heart disease Father   . Heart attack Sister      Social History   Social History  . Marital status: Married    Spouse name: N/A  . Number of  children: N/A  . Years of education: N/A   Occupational History  . Not on file.   Social History Main Topics  . Smoking status: Former Smoker    Quit date: 06/08/1982  . Smokeless tobacco: Former Neurosurgeon  . Alcohol use No  . Drug use: No  . Sexual activity: Not Currently   Other Topics Concern  . Not on file   Social History Narrative  . No narrative on file     BP 130/62   Pulse (!) 59   Ht 5\' 6"  (1.676 m)   Wt 150 lb (68 kg)   SpO2 99%   BMI 24.21 kg/m   Physical Exam:  Well appearing NAD HEENT: Unremarkable Neck:  No JVD, no thyromegally Lymphatics:  No adenopathy Back:  No CVA tenderness Lungs:  Clear HEART:  Regular rate rhythm, no murmurs, no rubs, no clicks Abd:  soft, positive bowel sounds, no organomegally, no rebound, no  guarding Ext:  2 plus pulses, no edema, no cyanosis, no clubbing Skin:  No rashes no nodules Neuro:  CN II through XII intact, motor grossly intact  Assess/Plan: 1. Atrial fib/flutter - he is maintaining NSR. He will continue Eliquis and low dose amiodarone. 2. CAD - he is s/p stenting. He will stop his ASA today and continue plavix. 3. VT - he has had no recurrent VT. He will continue amiodarone. 4. HTN - his blood pressure is reasonably well controlled. No change in meds.  Leonia Reeves.D.

## 2016-03-18 NOTE — Patient Instructions (Signed)
Your physician wants you to follow-up in: 6 months with Dr. Ladona Ridgelaylor.  You will receive a reminder letter in the mail two months in advance. If you don't receive a letter, please call our office to schedule the follow-up appointment.  Your physician has recommended you make the following change in your medication:   Stop Taking Aspirin   Continue Taking Gabapentin 300 mg Three Times Daily   If you need a refill on your cardiac medications before your next appointment, please call your pharmacy.  Thank you for choosing [facility]!

## 2017-01-16 IMAGING — CR DG CHEST 1V PORT
1 series · 1 of 1 positions shown · non-contrast
Comparison: Radiograph earlier this day at 4743 hours Jean Yvens Rigaud
[place]

CLINICAL DATA: Wide complex tachycardia.

EXAM:
PORTABLE CHEST 1 VIEW

[AP]
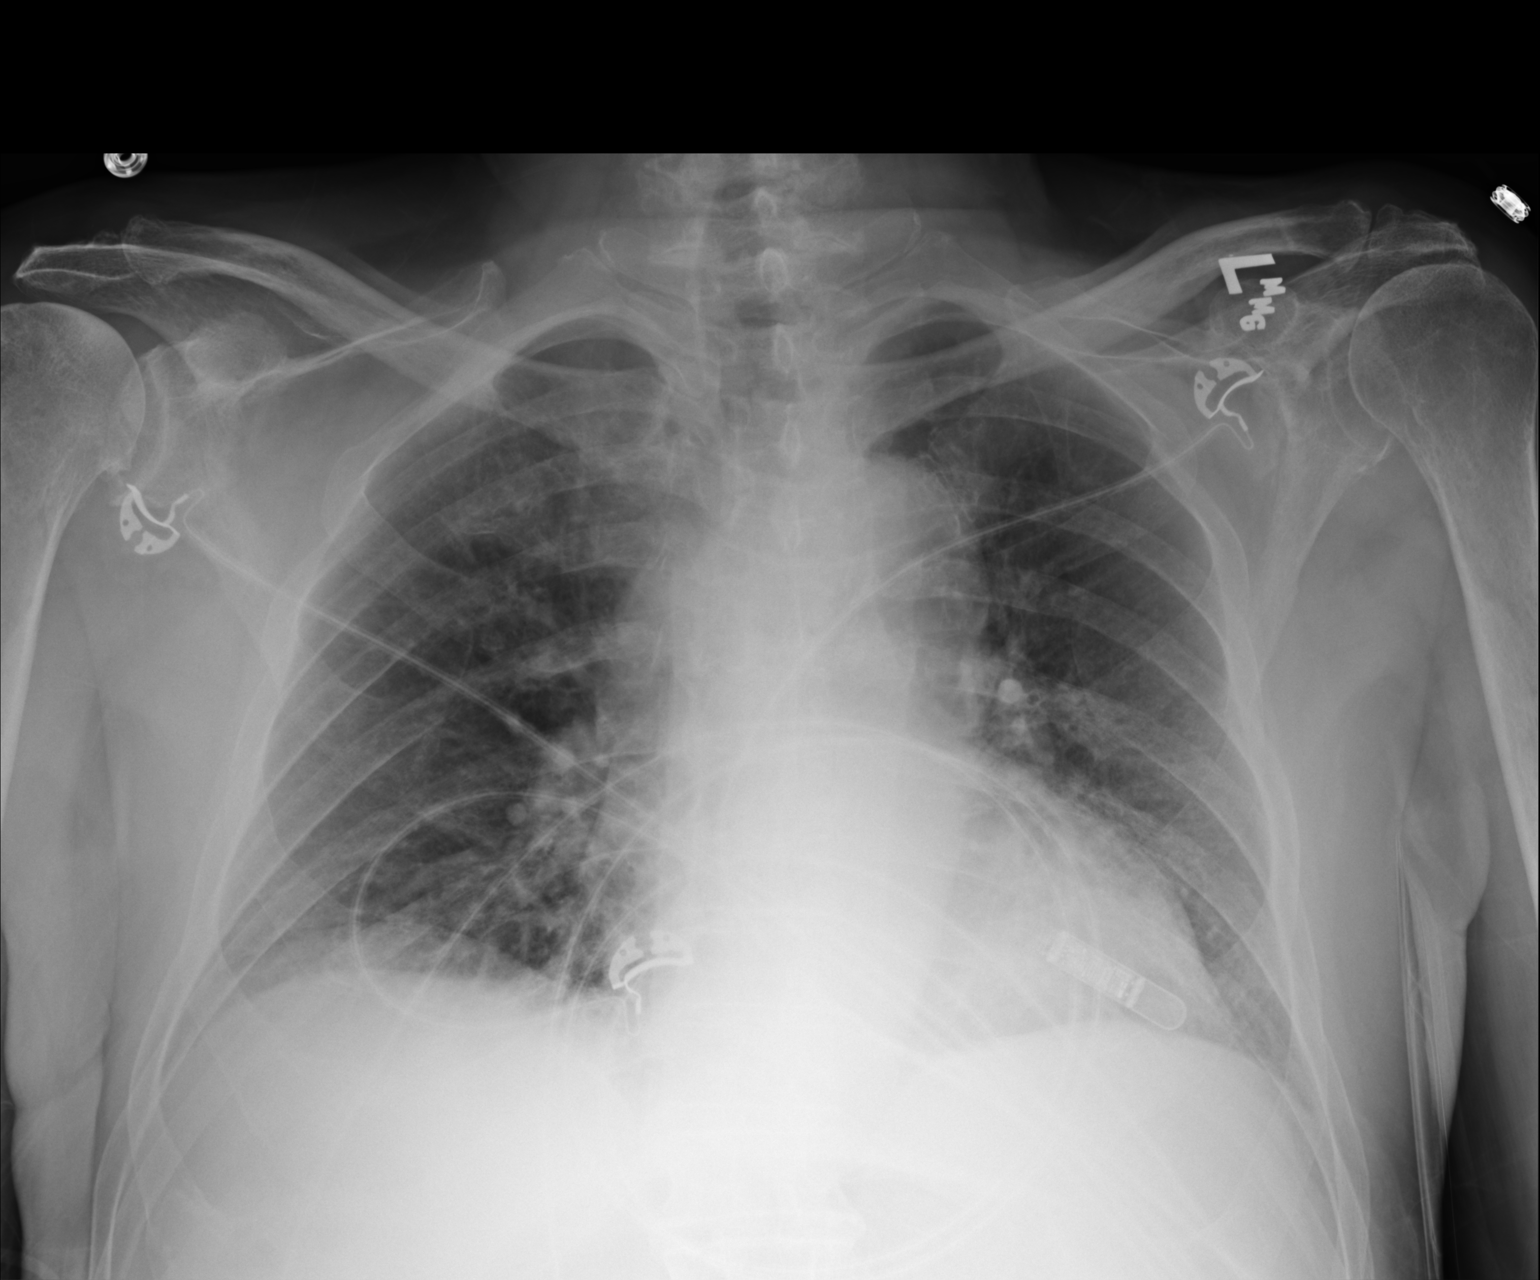

[1 of 1 positions shown; findings below may reference images not displayed]

FINDINGS: Stable cardiomegaly and mediastinal contours. Implanted monitoring
device projects over the left chest wall. No pulmonary edema, focal
airspace disease, pleural effusion or pneumothorax. Chronic
degenerative change in the shoulders.
IMPRESSION: Stable cardiomegaly.  No acute abnormality.

## 2019-10-02 ENCOUNTER — Ambulatory Visit: Payer: Medicare PPO | Admitting: Cardiovascular Disease

## 2024-04-21 DEATH — deceased
